# Patient Record
Sex: Female | Born: 1945 | Race: White | Hispanic: No | Marital: Married | Smoking: Never smoker
Health system: Southern US, Community
[De-identification: ages and names within clinical notes are randomized; demographics above are authoritative.]

## PROBLEM LIST (undated history)

## (undated) DIAGNOSIS — R519 Headache, unspecified: Secondary | ICD-10-CM

## (undated) DIAGNOSIS — N939 Abnormal uterine and vaginal bleeding, unspecified: Secondary | ICD-10-CM

## (undated) DIAGNOSIS — R51 Headache: Secondary | ICD-10-CM

## (undated) DIAGNOSIS — M1990 Unspecified osteoarthritis, unspecified site: Secondary | ICD-10-CM

## (undated) HISTORY — PX: HYSTEROSCOPY: SHX211

## (undated) HISTORY — PX: JOINT REPLACEMENT: SHX530

---

## 2004-09-15 ENCOUNTER — Emergency Department: Payer: Self-pay | Admitting: Emergency Medicine

## 2005-05-06 ENCOUNTER — Ambulatory Visit: Payer: Self-pay | Admitting: Unknown Physician Specialty

## 2006-07-29 ENCOUNTER — Ambulatory Visit: Payer: Self-pay | Admitting: Unknown Physician Specialty

## 2006-09-30 ENCOUNTER — Ambulatory Visit: Payer: Self-pay | Admitting: Unknown Physician Specialty

## 2009-09-25 ENCOUNTER — Ambulatory Visit: Payer: Self-pay | Admitting: Unknown Physician Specialty

## 2010-04-23 ENCOUNTER — Ambulatory Visit: Payer: Self-pay | Admitting: Unknown Physician Specialty

## 2010-04-26 ENCOUNTER — Ambulatory Visit: Payer: Self-pay | Admitting: Unknown Physician Specialty

## 2010-04-29 LAB — PATHOLOGY REPORT

## 2010-09-30 ENCOUNTER — Ambulatory Visit: Payer: Self-pay | Admitting: Unknown Physician Specialty

## 2013-08-10 ENCOUNTER — Ambulatory Visit: Payer: Self-pay | Admitting: General Practice

## 2013-08-10 LAB — URINALYSIS, COMPLETE
BLOOD: NEGATIVE
Bilirubin,UR: NEGATIVE
GLUCOSE, UR: NEGATIVE mg/dL (ref 0–75)
Ketone: NEGATIVE
NITRITE: NEGATIVE
PH: 5 (ref 4.5–8.0)
Protein: NEGATIVE
RBC,UR: 3 /HPF (ref 0–5)
Specific Gravity: 1.024 (ref 1.003–1.030)
Squamous Epithelial: <1

## 2013-08-10 LAB — CBC
HCT: 40 % (ref 35.0–47.0)
HGB: 13.2 g/dL (ref 12.0–16.0)
MCH: 29.6 pg (ref 26.0–34.0)
MCHC: 32.9 g/dL (ref 32.0–36.0)
MCV: 90 fL (ref 80–100)
Platelet: 212 10*3/uL (ref 150–440)
RBC: 4.46 10*6/uL (ref 3.80–5.20)
RDW: 14 % (ref 11.5–14.5)
WBC: 3.8 10*3/uL (ref 3.6–11.0)

## 2013-08-10 LAB — BASIC METABOLIC PANEL
Anion Gap: 3 — ABNORMAL LOW (ref 7–16)
BUN: 16 mg/dL (ref 7–18)
CALCIUM: 9.2 mg/dL (ref 8.5–10.1)
CHLORIDE: 104 mmol/L (ref 98–107)
CO2: 30 mmol/L (ref 21–32)
Creatinine: 0.65 mg/dL (ref 0.60–1.30)
EGFR (Non-African Amer.): >60
GLUCOSE: 85 mg/dL (ref 65–99)
Osmolality: 274 (ref 275–301)
POTASSIUM: 4 mmol/L (ref 3.5–5.1)
Sodium: 137 mmol/L (ref 136–145)

## 2013-08-10 LAB — MRSA PCR SCREENING

## 2013-08-10 LAB — APTT: Activated PTT: 35.4 secs (ref 23.6–35.9)

## 2013-08-10 LAB — SEDIMENTATION RATE: ERYTHROCYTE SED RATE: 8 mm/h (ref 0–30)

## 2013-08-10 LAB — PROTIME-INR
INR: 1
PROTHROMBIN TIME: 12.9 s (ref 11.5–14.7)

## 2013-08-12 LAB — URINE CULTURE

## 2013-08-17 ENCOUNTER — Inpatient Hospital Stay: Payer: Self-pay | Admitting: General Practice

## 2013-08-18 LAB — BASIC METABOLIC PANEL
Anion Gap: 5 — ABNORMAL LOW (ref 7–16)
BUN: 10 mg/dL (ref 7–18)
CHLORIDE: 108 mmol/L — AB (ref 98–107)
CO2: 28 mmol/L (ref 21–32)
Calcium, Total: 8.6 mg/dL (ref 8.5–10.1)
Creatinine: 0.68 mg/dL (ref 0.60–1.30)
EGFR (African American): >60
GLUCOSE: 90 mg/dL (ref 65–99)
OSMOLALITY: 280 (ref 275–301)
POTASSIUM: 3.6 mmol/L (ref 3.5–5.1)
Sodium: 141 mmol/L (ref 136–145)

## 2013-08-18 LAB — HEMOGLOBIN: HGB: 10.4 g/dL — ABNORMAL LOW (ref 12.0–16.0)

## 2013-08-18 LAB — PLATELET COUNT: Platelet: 135 10*3/uL — ABNORMAL LOW (ref 150–440)

## 2013-08-19 LAB — BASIC METABOLIC PANEL
ANION GAP: 5 — AB (ref 7–16)
BUN: 5 mg/dL — AB (ref 7–18)
Calcium, Total: 8.6 mg/dL (ref 8.5–10.1)
Chloride: 109 mmol/L — ABNORMAL HIGH (ref 98–107)
Co2: 25 mmol/L (ref 21–32)
Creatinine: 0.67 mg/dL (ref 0.60–1.30)
GLUCOSE: 97 mg/dL (ref 65–99)
Osmolality: 275 (ref 275–301)
Potassium: 3.5 mmol/L (ref 3.5–5.1)
Sodium: 139 mmol/L (ref 136–145)

## 2013-08-19 LAB — HEMOGLOBIN: HGB: 11.1 g/dL — ABNORMAL LOW (ref 12.0–16.0)

## 2013-08-19 LAB — PLATELET COUNT: Platelet: 145 10*3/uL — ABNORMAL LOW (ref 150–440)

## 2014-10-28 NOTE — Discharge Summary (Signed)
PATIENT NAME:  Brianna Hurst, Kess H MR#:  [id number] DATE OF BIRTH:  Dec 19, 1945  DATE OF ADMISSION:  08/17/2013 DATE OF DISCHARGE:  08/20/2013  ADMITTING DIAGNOSIS: Degenerative arthrosis of the left knee.   DISCHARGE DIAGNOSIS: Degenerative arthrosis of the left knee.   HISTORY: The patient is a pleasant 69 year old female who has been followed at [facility] for progression of left knee pain. She had reported a several year history of progressive left knee pain. She had localized most of the pain along the medial aspect of the knee. Her pain was aggravated with weight-bearing activities. She was having difficulty with stair ambulation, also having problems getting up and down. At the time of surgery, she was not using any ambulatory aid. The patient has been treated in the past with nonsteroidal anti-inflammatory medications, corticosteroid injections, as well as physical therapy with only minimal relief. The patient states that the pain increased to the point that it was significantly interfering with her activities of daily living. X-rays taken in [facility] showed narrowing of the medial cartilage space with associated varus alignment. She was noted to have osteophyte as well as subchondral sclerosis. After discussion of the risks and benefits of surgical intervention, the patient expressed her understanding of the risks and benefits and agreed for plans for surgical intervention.   PROCEDURE: Left total knee arthroplasty using computer-assisted navigation.   ANESTHESIA: Spinal.   SOFT TISSUE RELEASE: Anterior cruciate ligament, posterior cruciate ligament, deep medial collateral ligaments, as well as the patellofemoral ligament.   IMPLANTS UTILIZED: DePuy PFC Sigma size 3 posterior stabilized femoral component (cemented), size 3 MBT tibial component (cemented), 32 mm three-pegged oval dome patella (cemented), and a 12.5 mm stabilized rotating platform polyethylene insert.  Gentamicin cement was utilized.   HOSPITAL COURSE: The patient tolerated the procedure very well. She had no complications. She was then taken to the PAC-U where she was stabilized and then transferred to the orthopedic floor. The patient began receiving anticoagulation therapy of Lovenox 30 mg subcutaneous every 12 hours per anesthesia and pharmacy protocol. She was fitted with TED stockings bilaterally. The left one was applied on day 2 following removal of the Hemovac and dressing change. The patient was also fitted with the AV-I compression foot pumps bilaterally set at 80 mmHg. Her calves have been nontender. There has been no evidence of any DVTs. Negative Homans sign. Heels were elevated off the bed using rolled towels.   The patient has denied any chest pain or shortness of breath. Vital signs have been stable. She has been afebrile. Hemodynamically she was stable and no transfusions were given other than the Autovac transfusions given the first 6 hours postoperatively.   Physical therapy was initiated on day 1 for gait training and transfers. She has done very well. Upon being discharged was ambulating greater than 200 feet. She was independent with bed to chair transfers. She was able to go up 4 steps. Occupational therapy was also initiated on day 1 for ADL and assistive devices.   The patient's IV, Foley and Hemovac were DC'd on day 2 along with a dressing change. The wound was free of any drainage or signs of infection. Polar Care was reapplied to the surgical leg maintaining a temperature of 40 to 50 degrees Fahrenheit.   DRUG ALLERGIES: No known drug allergies.   DISCHARGE INSTRUCTIONS: The patient is to continue weight-bearing as tolerated. She will continue using a walker until cleared by physical therapy to go to a quad cane. She  will receive home health PT. She is instructed on elevation of the lower extremities. She is to continue with TED stockings bilaterally. These may be removed  at night but are to be worn during the day. She is to continue the Polar Care maintaining a temperature of 40 to 50 degrees Fahrenheit. For the first 2 weeks, we would like for her to try to wear this around-the-clock. Also recommend that she continue to try to use incentive spirometry q. hour while awake until she is more active. Encourage cough and deep breathing every 2 hours while awake. She was instructed in wound care. She is not to get the wound wet until the staples are removed. She has a follow-up appointment on February 26th at 8:45. Call if any temperatures of 101.5 or greater or excessive bleeding. She is to resume her regular medication that she was on prior to admission. She was given a prescription for oxycodone 5 to 10 mg q. 4 to 6 hours p.r.n. for pain, Ultram 50 to 100 mg q. 4 to 6 hours p.r.n. for pain, and Lovenox 40 mg subcu daily for 14 days and then discontinue and begin taking one 81 mg enteric-coated aspirin.   PAST MEDICAL HISTORY: Migraines, plantar fasciitis, mumps, varicella. ____________________________ Van Clines, PA jrw:sb D: 08/30/2013 08:34:27 ET T: 08/30/2013 09:17:24 ET JOB#: [id number]  cc: Van Clines, PA, <Dictator> Gotti Alwin PA ELECTRONICALLY SIGNED 09/05/2013 7:17

## 2014-10-28 NOTE — Op Note (Signed)
PATIENT NAME:  Brianna Hurst, Brianna Hurst MR#:  [id number] DATE OF BIRTH:  1945/08/24  DATE OF PROCEDURE:  08/17/2013  PREOPERATIVE DIAGNOSIS: Degenerative arthrosis of the left knee.   POSTOPERATIVE DIAGNOSIS: Degenerative arthrosis of the left knee.   PROCEDURE PERFORMED: Left total knee arthroplasty using computer-assisted navigation.   SURGEON: Illene Labrador. Angie Fava., M.D.   ASSISTANT: Van Clines, PA (required to maintain retraction throughout the procedure).   ANESTHESIA: Spinal.   ESTIMATED BLOOD LOSS: 50 mL.   FLUIDS REPLACED: 1200 mL of crystalloid.   TOURNIQUET TIME: 83 minutes.   DRAINS: Two medium drains to reinfusion system.   SOFT TISSUE RELEASES: Anterior cruciate ligament, posterior cruciate ligament, deep medial collateral ligament, and patellofemoral ligament.   IMPLANTS UTILIZED: DePuy PFC Sigma size 3 posterior stabilized femoral component (cemented), size 3 MBT tibial component (cemented), 32 mm 3 peg oval dome patella (cemented), and a 12.5 mm stabilized rotating platform polyethylene insert. Gentamicin cement was utilized.   INDICATIONS FOR SURGERY: The patient is a 69 year old female who has been seen for complaints of progressive left knee pain. X-rays demonstrated significant degenerative changes with relative varus deformity. After discussion of the risks and benefits of surgical intervention, the patient expressed understanding of the risks and benefits and agreed with plans for surgical intervention.   PROCEDURE IN DETAIL: The patient was brought into the operating room and, after adequate spinal anesthesia was achieved, a tourniquet was placed on the patient's upper left thigh. The patient's left knee and leg were cleaned and prepped with alcohol and DuraPrep and draped in the usual sterile fashion. A "timeout" was performed as per usual protocol. The left lower extremity was exsanguinated using an Esmarch, and the tourniquet was inflated to 300 mmHg. An anterior  longitudinal incision was made followed by a standard mid vastus approach. A large effusion was evacuated. The deep fibers of the medial collateral ligament were elevated in a subperiosteal fashion off the medial flare of the tibia so as to maintain a continuous soft tissue sleeve. The patella was subluxed laterally, and the patellofemoral ligament was incised. Inspection of the knee demonstrated severe degenerative changes with full-thickness loss of articular cartilage to the medial compartment. Significant degenerative changes were also noted to the patella. Osteophytes were debrided using a rongeur. Anterior and posterior cruciate ligaments were excised. Two 4.0 mm Schanz pins were inserted into the femur and into the tibia for attachment of the array of trackers used for computer-assisted navigation. Hip center was identified using circumduction technique. Distal landmarks were mapped using the computer. The distal femur and proximal tibia were mapped using the computer. Distal femoral cutting guide was positioned using computer-assisted navigation so as to achieve a 5-degree distal valgus cut. Cut was performed and verified using the computer. Distal femur was sized, and it was felt that a size 3 femoral component was appropriate. A size 3 cutting guide was positioned, and anterior cut was performed and verified using the computer. This was followed by completion of the posterior and chamfer cuts. Femoral cutting guide for the central box was then positioned, and the central box cut was performed. Attention was then directed to the proximal tibia. Medial and lateral menisci were excised. The extramedullary tibial cutting guide was positioned using computer-assisted navigation so as to achieve a 0-degree varus/valgus alignment and 0-degree posterior slope. Cut was performed and verified using the computer. The proximal tibia was sized, and it was felt that a size 3 tibial tray was appropriate. Tibial and  femoral trials  were inserted followed by insertion of first a 10 and subsequently a 12.5 mm polyethylene insert. This allowed for excellent mediolateral soft tissue balancing both in full extension and in flexion. Finally, the patella was cut and prepared so as to accommodate a 32 mm 3 peg oval dome patella. Patellar trial was placed, and the knee was placed through a range of motion with excellent patellar tracking appreciated.   Femoral trial was removed. Central post hole for the tibial component was reamed followed by insertion of a keel punch. Tibial trials were then removed. The cut surfaces of bone were irrigated with copious amounts of normal saline with antibiotic solution using pulsatile lavage and then suctioned dry. Polymethylmethacrylate cement with gentamicin was prepared in the usual fashion using a vacuum mixer. Cement was applied to the cut surface of the proximal tibia as well as along the undersurface of a size 3 MBT tibial component. The tibial component was positioned and impacted into place. Excess cement was removed using Personal assistantreer elevators. Cement was then applied to the cut surface of the femur as well as on the posterior flanges of a size 3 posterior stabilized femoral component. Femoral component was positioned and impacted into place. Excess cement was removed using Personal assistantreer elevators. A 12.5 mm polyethylene trial was inserted, and the knee was brought into full extension with steady axial compression applied. Finally, cement was applied to the backside of a 32 mm 3 peg oval dome patella, and patellar component was positioned and patellar clamp applied. Excess cement was removed using Personal assistantreer elevators.   After adequate curing of cement, the tourniquet was deflated after a total tourniquet time of 83 minutes. Hemostasis was achieved using electrocautery. The knee was irrigated with copious amounts of normal saline with antibiotic solution using pulsatile lavage and then suctioned dry. The  knee was inspected for any residual cement debris. Then, 20 mL of 1.3% Exparel in 40 mL of normal saline was injected along the posterior capsule, medial and lateral gutters, and along the arthrotomy site. A 12.5 mm stabilized rotating platform polyethylene insert was inserted, and the knee was placed through range of motion with excellent patellar tracking appreciated and excellent mediolateral soft tissue balancing noted. Two medium drains were placed in the wound bed and brought out through a separate stab incision to be attached to a reinfusion system. The medial parapatellar portion of the incision was reapproximated using interrupted sutures of #1 Vicryl. The subcutaneous tissue was approximated in layers using first #0 Vicryl followed by #2-0 Vicryl. Skin was closed with skin staples. Sterile dressing was applied.   The patient tolerated the procedure well. She was transported to the recovery room in stable condition.    ____________________________ Illene LabradorJames P. Angie FavaHooten Jr., MD jph:gb D: 08/17/2013 21:13:15 ET T: 08/17/2013 21:56:08 ET JOB#: [id number]  cc: Illene LabradorJames P. Angie FavaHooten Jr., MD, <Dictator> JAMES P Angie FavaHOOTEN JR MD ELECTRONICALLY SIGNED 08/26/2013 6:42

## 2015-02-05 IMAGING — CR DG KNEE 1-2V*L*
1 series · 2 of 2 positions shown · non-contrast
Comparison: None.

CLINICAL DATA: Status post arthroplasty.

EXAM:
LEFT KNEE - 1-2 VIEW

[Series 1: ap · 0.17mm/px · 2 of 2 slices shown]
[im 1/2]
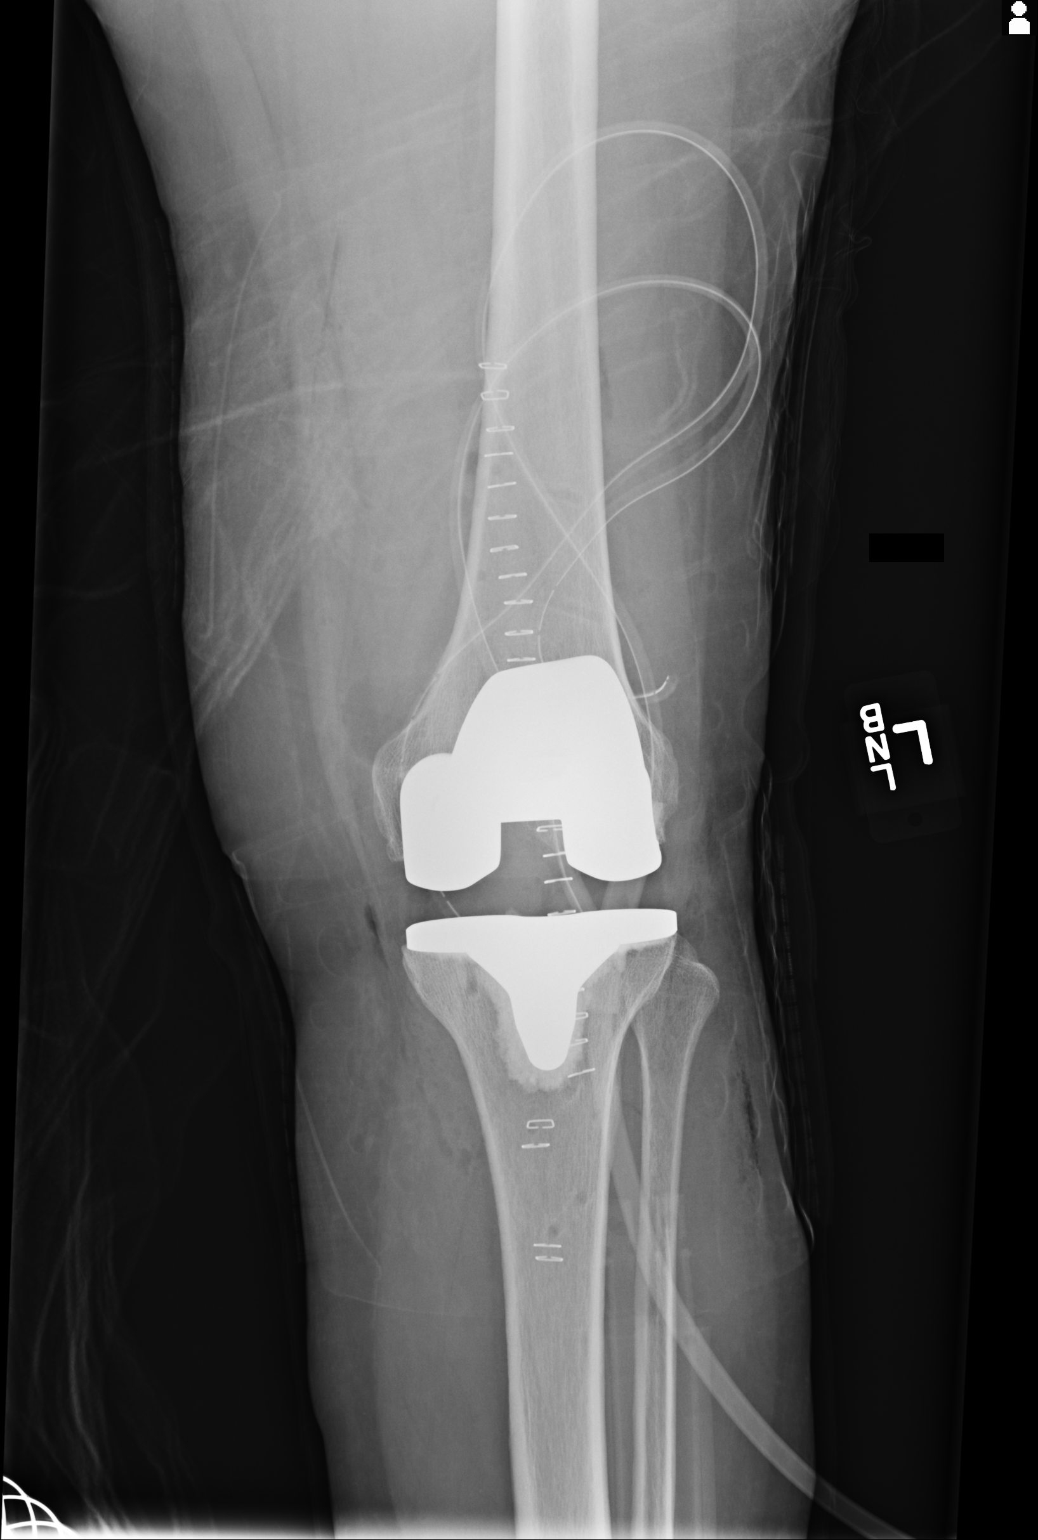
[im 2/2]
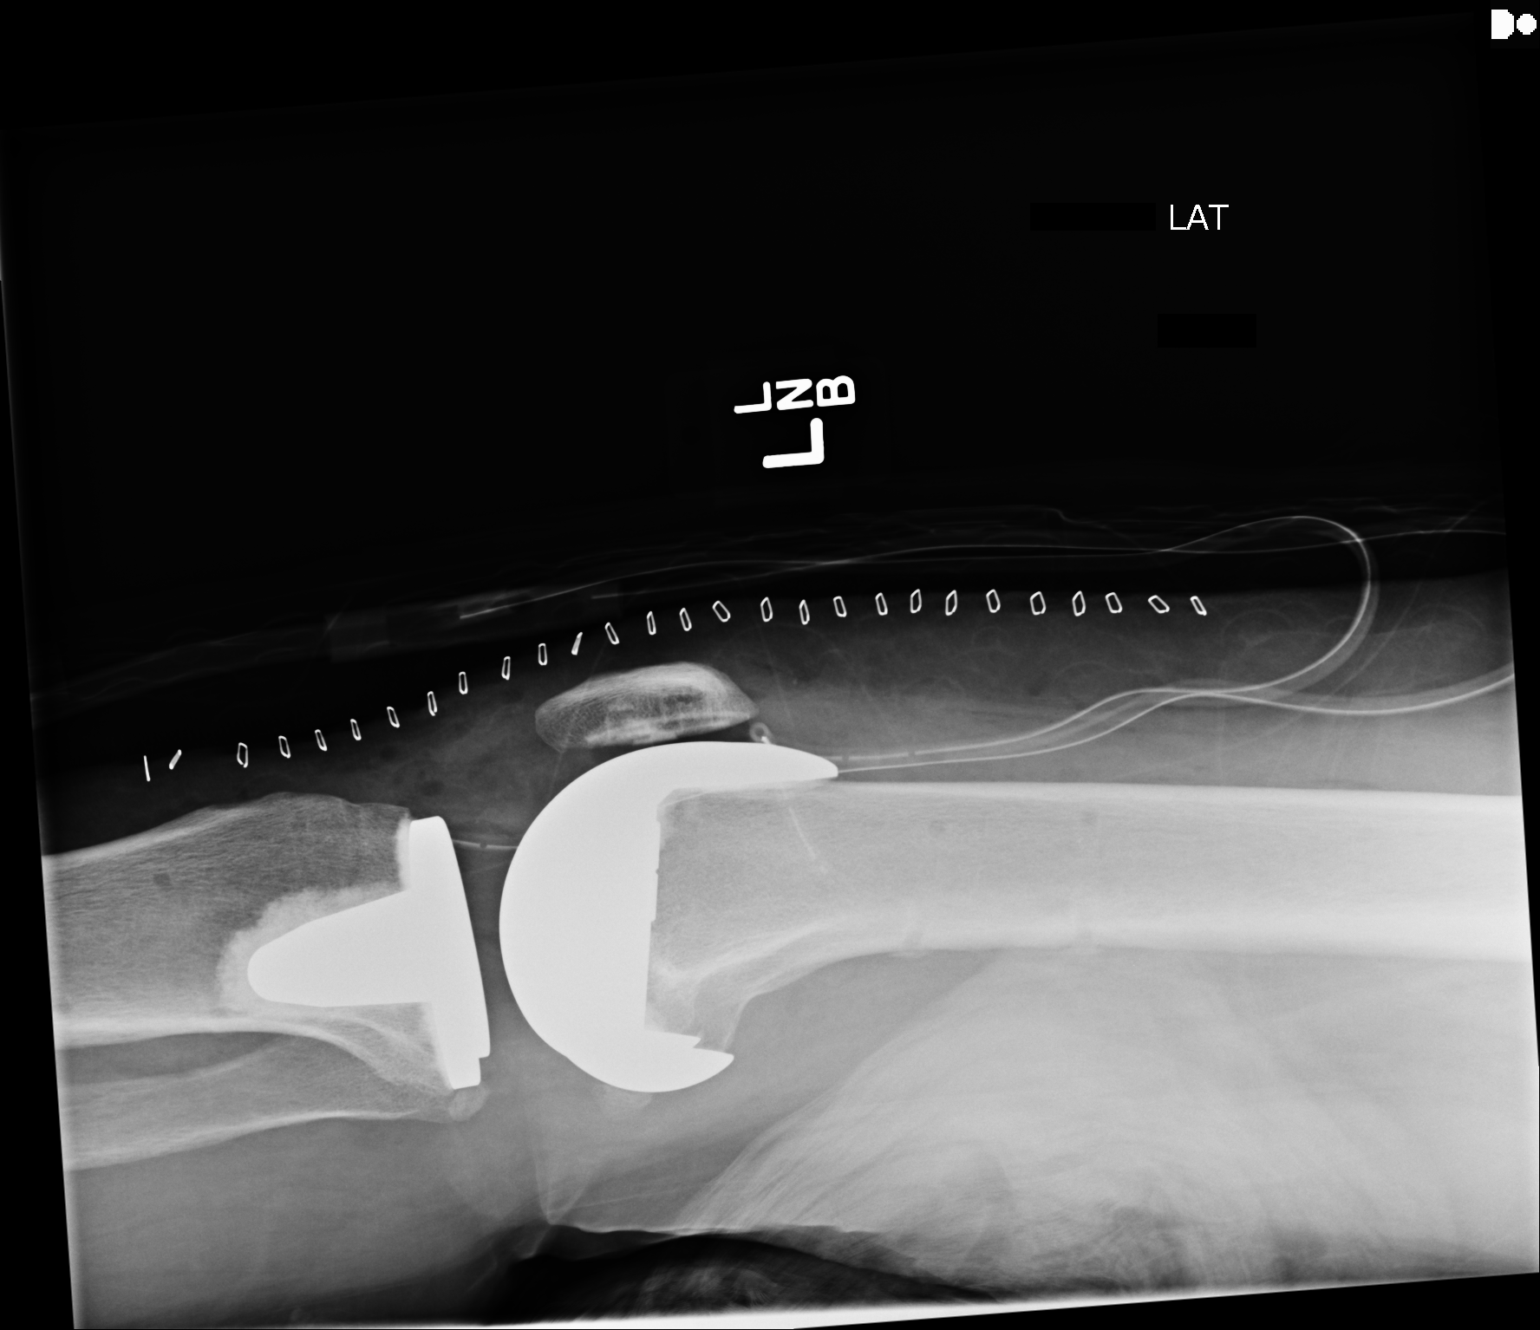

[2 of 2 positions shown; findings below may reference images not displayed]

FINDINGS: The femoral tibial components appear to be well positioned. Surgical
staples are noted. Surgical drains are noted as well.
IMPRESSION: Status post left total knee arthroplasty.

## 2015-07-13 ENCOUNTER — Encounter
Admission: RE | Admit: 2015-07-13 | Discharge: 2015-07-13 | Disposition: A | Discharge status: Home / Self Care | Payer: Medicare Other | Source: Ambulatory Visit | Attending: Obstetrics & Gynecology | Admitting: Obstetrics & Gynecology

## 2015-07-13 DIAGNOSIS — Z01810 Encounter for preprocedural cardiovascular examination: Secondary | ICD-10-CM | POA: Insufficient documentation

## 2015-07-13 HISTORY — DX: Headache, unspecified: R51.9

## 2015-07-13 HISTORY — DX: Abnormal uterine and vaginal bleeding, unspecified: N93.9

## 2015-07-13 HISTORY — DX: Headache: R51

## 2015-07-13 HISTORY — DX: Unspecified osteoarthritis, unspecified site: M19.90

## 2015-07-13 LAB — CBC
HCT: 41.2 % (ref 35.0–47.0)
Hemoglobin: 13.6 g/dL (ref 12.0–16.0)
MCH: 28.8 pg (ref 26.0–34.0)
MCHC: 33 g/dL (ref 32.0–36.0)
MCV: 87.2 fL (ref 80.0–100.0)
Platelets: 211 10*3/uL (ref 150–440)
RBC: 4.73 MIL/uL (ref 3.80–5.20)
RDW: 14 % (ref 11.5–14.5)
WBC: 4.2 10*3/uL (ref 3.6–11.0)

## 2015-07-13 LAB — TYPE AND SCREEN
ABO/RH(D): O POS
ANTIBODY SCREEN: NEGATIVE

## 2015-07-13 LAB — ABO/RH: ABO/RH(D): O POS

## 2015-07-13 NOTE — Patient Instructions (Signed)
  Your procedure is scheduled on: 07/20/15 Fri Report to Day Surgery. To find out your arrival time please call [phone] between 1PM - 3PM on 07/19/15 Thur.  Remember: Instructions that are not followed completely may result in serious medical risk, up to and including death, or upon the discretion of your surgeon and anesthesiologist your surgery may need to be rescheduled.    __x__ 1. Do not eat food or drink liquids after midnight. No gum chewing or hard candies.     ____ 2. No Alcohol for 24 hours before or after surgery.   ____ 3. Bring all medications with you on the day of surgery if instructed.    __x__ 4. Notify your doctor if there is any change in your medical condition     (cold, fever, infections).     Do not wear jewelry, make-up, hairpins, clips or nail polish.  Do not wear lotions, powders, or perfumes. You may wear deodorant.  Do not shave 48 hours prior to surgery. Men may shave face and neck.  Do not bring valuables to the hospital.    [facility] for any belongings or valuables.               Contacts, dentures or bridgework may not be worn into surgery.  Leave your suitcase in the car. After surgery it may be brought to your room.  For patients admitted to the hospital, discharge time is determined by your                treatment team.   Patients discharged the day of surgery will not be allowed to drive home.   Please read over the following fact sheets that you were given:      ____ Take these medicines the morning of surgery with A SIP OF WATER:    1. None  2.   3.   4.  5.  6.  ____ Fleet Enema (as directed)   ____ Use CHG Soap as directed  ____ Use inhalers on the day of surgery  ____ Stop metformin 2 days prior to surgery    ____ Take 1/2 of usual insulin dose the night before surgery and none on the morning of surgery.   ____ Stop Coumadin/Plavix/aspirin on   _x___ Stop Anti-inflammatories on No asprin, ibprofen,  etc til after surgery.  May use Tylenol as needed   ____ Stop supplements until after surgery.    ____ Bring C-Pap to the hospital.

## 2015-07-20 ENCOUNTER — Encounter: Payer: Self-pay | Admitting: *Deleted

## 2015-07-20 ENCOUNTER — Encounter
Admission: RE | Disposition: A | Discharge status: Home / Self Care | Payer: Self-pay | Source: Ambulatory Visit | Attending: Obstetrics & Gynecology

## 2015-07-20 ENCOUNTER — Ambulatory Visit
Admission: RE | Admit: 2015-07-20 | Discharge: 2015-07-20 | Disposition: A | Discharge status: Home / Self Care | Payer: Medicare Other | Source: Ambulatory Visit | Attending: Obstetrics & Gynecology | Admitting: Obstetrics & Gynecology

## 2015-07-20 ENCOUNTER — Ambulatory Visit: Payer: Medicare Other | Admitting: Anesthesiology

## 2015-07-20 DIAGNOSIS — N950 Postmenopausal bleeding: Secondary | ICD-10-CM | POA: Diagnosis not present

## 2015-07-20 DIAGNOSIS — Z96659 Presence of unspecified artificial knee joint: Secondary | ICD-10-CM | POA: Insufficient documentation

## 2015-07-20 DIAGNOSIS — Z803 Family history of malignant neoplasm of breast: Secondary | ICD-10-CM | POA: Insufficient documentation

## 2015-07-20 DIAGNOSIS — Z79899 Other long term (current) drug therapy: Secondary | ICD-10-CM | POA: Diagnosis not present

## 2015-07-20 DIAGNOSIS — Z833 Family history of diabetes mellitus: Secondary | ICD-10-CM | POA: Diagnosis not present

## 2015-07-20 DIAGNOSIS — Z8249 Family history of ischemic heart disease and other diseases of the circulatory system: Secondary | ICD-10-CM | POA: Insufficient documentation

## 2015-07-20 DIAGNOSIS — N939 Abnormal uterine and vaginal bleeding, unspecified: Secondary | ICD-10-CM

## 2015-07-20 HISTORY — PX: DILATATION & CURETTAGE/HYSTEROSCOPY WITH MYOSURE: SHX6511

## 2015-07-20 SURGERY — DILATATION & CURETTAGE/HYSTEROSCOPY WITH MYOSURE
Anesthesia: General

## 2015-07-20 MED ORDER — ONDANSETRON HCL 4 MG/2ML IJ SOLN
4.0000 mg | Freq: Once | INTRAMUSCULAR | Status: DC | PRN
Start: 2015-07-20 — End: 2015-07-20

## 2015-07-20 MED ORDER — DEXAMETHASONE SODIUM PHOSPHATE 10 MG/ML IJ SOLN
INTRAMUSCULAR | Status: DC | PRN
  Administered 2015-07-20: 4 mg via INTRAVENOUS

## 2015-07-20 MED ORDER — LACTATED RINGERS IV SOLN
INTRAVENOUS | Status: DC
  Administered 2015-07-20 (×2): via INTRAVENOUS

## 2015-07-20 MED ORDER — ESTROGENS, CONJUGATED 0.625 MG/GM VA CREA
TOPICAL_CREAM | VAGINAL | Status: AC
  Filled 2015-07-20: qty 30

## 2015-07-20 MED ORDER — FAMOTIDINE 20 MG PO TABS
20.0000 mg | ORAL_TABLET | Freq: Once | ORAL | Status: AC
  Administered 2015-07-20: 20 mg via ORAL

## 2015-07-20 MED ORDER — ONDANSETRON HCL 4 MG/2ML IJ SOLN
INTRAMUSCULAR | Status: DC | PRN
  Administered 2015-07-20: 4 mg via INTRAVENOUS

## 2015-07-20 MED ORDER — MIDAZOLAM HCL 2 MG/2ML IJ SOLN
INTRAMUSCULAR | Status: DC | PRN
  Administered 2015-07-20: 2 mg via INTRAVENOUS

## 2015-07-20 MED ORDER — FENTANYL CITRATE (PF) 100 MCG/2ML IJ SOLN: 25.0000 ug | INTRAMUSCULAR | Status: DC | PRN

## 2015-07-20 MED ORDER — ESTROGENS, CONJUGATED 0.625 MG/GM VA CREA
TOPICAL_CREAM | VAGINAL | Status: DC | PRN
  Administered 2015-07-20: 1 via VAGINAL

## 2015-07-20 MED ORDER — SILVER NITRATE-POT NITRATE 75-25 % EX MISC
CUTANEOUS | Status: AC
  Filled 2015-07-20: qty 4

## 2015-07-20 MED ORDER — FENTANYL CITRATE (PF) 100 MCG/2ML IJ SOLN
INTRAMUSCULAR | Status: DC | PRN
  Administered 2015-07-20: 50 ug via INTRAVENOUS

## 2015-07-20 MED ORDER — FAMOTIDINE 20 MG PO TABS
ORAL_TABLET | ORAL | Status: AC
  Filled 2015-07-20: qty 1

## 2015-07-20 MED ORDER — LIDOCAINE HCL (CARDIAC) 20 MG/ML IV SOLN
INTRAVENOUS | Status: DC | PRN
  Administered 2015-07-20: 60 mg via INTRAVENOUS

## 2015-07-20 MED ORDER — OXYCODONE-ACETAMINOPHEN 5-325 MG PO TABS: 1.0000 | ORAL_TABLET | ORAL | Status: AC | PRN

## 2015-07-20 MED ORDER — KETOROLAC TROMETHAMINE 30 MG/ML IJ SOLN
30.0000 mg | Freq: Four times a day (QID) | INTRAMUSCULAR | Status: DC
  Administered 2015-07-20: 30 mg via INTRAVENOUS
  Filled 2015-07-20: qty 1

## 2015-07-20 MED ORDER — PROPOFOL 10 MG/ML IV BOLUS
INTRAVENOUS | Status: DC | PRN
  Administered 2015-07-20: 150 mg via INTRAVENOUS

## 2015-07-20 SURGICAL SUPPLY — 23 items
ABLATOR ENDOMETRIAL MYOSURE (ABLATOR) IMPLANT
BAG COUNTER SPONGE EZ (MISCELLANEOUS) ×2 IMPLANT
CANISTER SUC SOCK COL 7IN (MISCELLANEOUS) ×3 IMPLANT
CATH ROBINSON RED A/P 16FR (CATHETERS) ×3 IMPLANT
COUNTER SPONGE BAG EZ (MISCELLANEOUS) ×1
GAUZE PACK 2X3YD (MISCELLANEOUS) ×3 IMPLANT
GLOVE BIO SURGEON STRL SZ8 (GLOVE) ×3 IMPLANT
GOWN STRL REUS W/ TWL LRG LVL3 (GOWN DISPOSABLE) ×1 IMPLANT
GOWN STRL REUS W/ TWL XL LVL3 (GOWN DISPOSABLE) ×1 IMPLANT
GOWN STRL REUS W/TWL LRG LVL3 (GOWN DISPOSABLE) ×2
GOWN STRL REUS W/TWL XL LVL3 (GOWN DISPOSABLE) ×2
MYOSURE LITE POLYP REMOVAL (MISCELLANEOUS) IMPLANT
PACK DNC HYST (MISCELLANEOUS) ×3 IMPLANT
PAD GROUND ADULT SPLIT (MISCELLANEOUS) ×3 IMPLANT
PAD OB MATERNITY 4.3X12.25 (PERSONAL CARE ITEMS) ×3 IMPLANT
PAD PREP 24X41 OB/GYN DISP (PERSONAL CARE ITEMS) ×3 IMPLANT
SOL .9 NS 3000ML IRR  AL (IV SOLUTION) ×2
SOL .9 NS 3000ML IRR UROMATIC (IV SOLUTION) ×1 IMPLANT
STRAP SAFETY BODY (MISCELLANEOUS) ×3 IMPLANT
TOWEL OR 17X26 4PK STRL BLUE (TOWEL DISPOSABLE) ×3 IMPLANT
TUBING CONNECTING 10 (TUBING) ×2 IMPLANT
TUBING CONNECTING 10' (TUBING) ×1
TUBING HYSTEROSCOPY DOLPHIN (MISCELLANEOUS) ×3 IMPLANT

## 2015-07-20 NOTE — Anesthesia Postprocedure Evaluation (Signed)
Anesthesia Post Note  Patient: Brianna Hurst  Procedure(s) Performed: Procedure(s) (LRB): DILATATION & CURETTAGE/HYSTEROSCOPY WITH MYOSURE (N/A)  Patient location during evaluation: PACU Anesthesia Type: General Level of consciousness: awake and alert Pain management: pain level controlled Vital Signs Assessment: post-procedure vital signs reviewed and stable Respiratory status: spontaneous breathing and respiratory function stable Cardiovascular status: stable Anesthetic complications: no    Last Vitals:  Filed Vitals:   07/20/15 1213 07/20/15 1232  BP:  119/67  Pulse:  69  Temp: 37.1 C 35.9 C  Resp:  18    Last Pain:  Filed Vitals:   07/20/15 1234  PainSc: 1                  KEPHART,WILLIAM K

## 2015-07-20 NOTE — Anesthesia Procedure Notes (Signed)
Procedure Name: LMA Insertion Date/Time: 07/20/2015 10:45 AM Performed by: Jannet MantisPACE, Evangelynn Lochridge Pre-anesthesia Checklist: Patient identified, Emergency Drugs available, Suction available, Patient being monitored and Timeout performed Patient Re-evaluated:Patient Re-evaluated prior to inductionOxygen Delivery Method: Circle system utilized Preoxygenation: Pre-oxygenation with 100% oxygen Intubation Type: IV induction Ventilation: Mask ventilation without difficulty LMA: LMA inserted LMA Size: 4.0 Number of attempts: 1 Placement Confirmation: positive ETCO2 and CO2 detector Dental Injury: Teeth and Oropharynx as per pre-operative assessment

## 2015-07-20 NOTE — Anesthesia Preprocedure Evaluation (Signed)
Anesthesia Evaluation  Patient identified by MRN, date of birth, ID band Patient awake    Reviewed: Allergy & Precautions, NPO status , Patient's Chart, lab work & pertinent test results  History of Anesthesia Complications Negative for: history of anesthetic complications  Airway Mallampati: I       Dental   Pulmonary neg pulmonary ROS,           Cardiovascular negative cardio ROS       Neuro/Psych negative neurological ROS     GI/Hepatic negative GI ROS, Neg liver ROS,   Endo/Other  negative endocrine ROS  Renal/GU negative Renal ROS     Musculoskeletal  (+) Arthritis , Osteoarthritis,    Abdominal   Peds  Hematology negative hematology ROS (+)   Anesthesia Other Findings 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     Plan Discussed with:   Anesthesia Plan Comments:         Anesthesia Quick Evaluation

## 2015-07-20 NOTE — H&P (Signed)
History and Physical Interval Note:  07/20/2015 9:40 AM  Brianna Hurst  has presented today for surgery, with the diagnosis of endometrial abnormality  The various methods of treatment have been discussed with the patient and family. After consideration of risks, benefits and other options for treatment, the patient has consented to  Procedure(s): DILATATION & CURETTAGE/HYSTEROSCOPY WITH MYOSURE (N/A) as a surgical intervention .  The patient's history has been reviewed, patient examined, no change in status, stable for surgery.  Pt has the following beta blocker history-  Not taking Beta Blocker.  I have reviewed the patient's chart and labs.  Questions were answered to the patient's satisfaction.       Wilmetta Speiser Renae FicklePAUL

## 2015-07-20 NOTE — Transfer of Care (Signed)
Immediate Anesthesia Transfer of Care Note  Patient: Brianna Hurst  Procedure(s) Performed: Procedure(s): DILATATION & CURETTAGE/HYSTEROSCOPY WITH MYOSURE (N/A)  Patient Location: PACU  Anesthesia Type:General  Level of Consciousness: awake  Airway & Oxygen Therapy: Patient Spontanous Breathing  Post-op Assessment: Report given to RN  Post vital signs: stable  Last Vitals:  Filed Vitals:   07/20/15 0936 07/20/15 1126  BP: 112/73 121/63  Pulse: 69 79  Temp: 36.7 C 36.4 C  Resp: 16 18    Complications: No apparent anesthesia complications

## 2015-07-20 NOTE — Op Note (Signed)
Operative Note  07/20/2015  PRE-OP DIAGNOSIS: Postmenopausal Bleeding  POST-OP DIAGNOSIS: same   SURGEON: Annamarie MajorPaul Roosevelt Eimers, MD, FACOG  PROCEDURE: Procedure(s): DILATATION & CURETTAGE/HYSTEROSCOPY WITH MYOSURE   ANESTHESIA: Choice   ESTIMATED BLOOD LOSS: Min, <100 mL   SPECIMENS:  ECC, EMC  FLUID DEFICIT: Min  COMPLICATIONS: None  DISPOSITION: PACU - hemodynamically stable.  CONDITION: stable  FINDINGS: Exam under anesthesia revealed small, mobile  uterus with no masses and bilateral adnexa without masses or fullness. Hysteroscopy revealed a  grossly normal appearing uterine cavity with bilateral tubal ostia and normal appearing endocervical canal.  PROCEDURE IN DETAIL: After informed consent was obtained, the patient was taken to the operating room where anesthesia was obtained without difficulty. The patient was positioned in the dorsal lithotomy position in [place] stirrups. The patient's bladder was catheterized with an in and out foley catheter. The patient was examined under anesthesia, with the above noted findings. The weightedspeculum was placed inside the patient's vagina, and the the anterior lip of the cervix was seen and grasped with the tenaculum.  An Endocervical specimen was obtained with a kevorkian curette. The uterine cavity was sounded to 6cm, and then the cervix was progressively dilated to a 16 French-Pratt dilator. The 30 degree hysteroscope was introduced, with saline fluid used to distend the intrauterine cavity, with the above noted findings.  The hystersocope was removed and the uterine cavity was curetted until a gritty texture was noted, yielding endometrial curettings. Excellent hemostasis was noted, and all instruments were removed, with excellent hemostasis noted throughout. She was then taken out of dorsal lithotomy. Minimal discrepancy in fluid was noted.  The patient tolerated the procedure well. Sponge, lap and needle counts were correct x2. The patient was  taken to recovery room in excellent condition.

## 2015-07-20 NOTE — Discharge Instructions (Signed)
General Gynecological Post-Operative Instructions °You may expect to feel dizzy, weak, and drowsy for as long as 24 hours after receiving the medicine that made you sleep (anesthetic).  °Do not drive a car, ride a bicycle, participate in physical activities, or take public transportation until you are done taking narcotic pain medicines or as directed by your doctor.  °Do not drink alcohol or take tranquilizers.  °Do not take medicine that has not been prescribed by your doctor.  °Do not sign important papers or make important decisions while on narcotic pain medicines.  °Have a responsible person with you.  °CARE OF INCISION  °Keep incision clean and dry. °Take showers instead of baths until your doctor gives you permission to take baths.  °Avoid heavy lifting (more than 10 pounds/4.5 kilograms), pushing, or pulling.  °Avoid activities that may risk injury to your surgical site.  °No sexual intercourse or placement of anything in the vagina for 2 weeks or as instructed by your doctor. °If you have tubes coming from the wound site, check with your doctor regarding appropriate care of the tubes. °Only take prescription or over-the-counter medicines  for pain, discomfort, or fever as directed by your doctor. Do not take aspirin. It can make you bleed. Take medicines (antibiotics) that kill germs if they are prescribed for you.  °Call the office or go to the ER if:  °You feel sick to your stomach (nauseous) and you start to throw up (vomit).  °You have trouble eating or drinking.  °You have an oral temperature above 101.  °You have constipation that is not helped by adjusting diet or increasing fluid intake. Pain medicines are a common cause of constipation.  °You have any other concerns. °SEEK IMMEDIATE MEDICAL CARE IF:  °You have persistent dizziness.  °You have difficulty breathing or a congested sounding (croupy) cough.  °You have an oral temperature above 102.5, not controlled by medicine.  °There is increasing  pain or tenderness near or in the surgical site.  ° ° ° °

## 2015-07-23 LAB — SURGICAL PATHOLOGY

## 2015-12-10 ENCOUNTER — Ambulatory Visit: Payer: Medicare Other | Attending: Obstetrics & Gynecology | Admitting: Physical Therapy

## 2016-02-06 ENCOUNTER — Ambulatory Visit: Payer: Medicare Other | Attending: Obstetrics & Gynecology | Admitting: Physical Therapy

## 2016-02-06 DIAGNOSIS — M25562 Pain in left knee: Secondary | ICD-10-CM | POA: Diagnosis present

## 2016-02-06 DIAGNOSIS — M25561 Pain in right knee: Secondary | ICD-10-CM | POA: Diagnosis not present

## 2016-02-06 DIAGNOSIS — M25511 Pain in right shoulder: Secondary | ICD-10-CM | POA: Insufficient documentation

## 2016-02-06 DIAGNOSIS — M6281 Muscle weakness (generalized): Secondary | ICD-10-CM | POA: Insufficient documentation

## 2016-02-07 NOTE — Therapy (Signed)
[facility] [address], [place], [address] Phone: [phone]   Fax:  [phone]  Physical Therapy Evaluation  Patient Details  Name: Brianna Hurst MRN: [id number] Date of Birth: 10-11-1945 Referring Provider: Artis Flock  Encounter Date: 02/06/2016      PT End of Session - 02/07/16 0651    Visit Number 1   Number of Visits 13   Date for PT Re-Evaluation 03/20/16   Authorization Type g code   PT Start Time 0730   PT Stop Time 0830   PT Time Calculation (min) 60 min   Activity Tolerance Patient tolerated treatment well      Past Medical History:  Diagnosis Date  . Arthritis   . Headache   . Vaginal bleeding    discomfort    Past Surgical History:  Procedure Laterality Date  . DILATATION & CURETTAGE/HYSTEROSCOPY WITH MYOSURE N/A 07/20/2015   Procedure: DILATATION & CURETTAGE/HYSTEROSCOPY WITH MYOSURE;  Surgeon: Nadara Mustard, MD;  Location: [facility] ORS;  Service: Gynecology;  Laterality: N/A;  . HYSTEROSCOPY    . JOINT REPLACEMENT     Lt knee    There were no vitals filed for this visit.       Subjective Assessment - 02/06/16 0811    Subjective Pt reports several weeks of shoulder and knee pain. Shoulder pain began when pt was preparing for a visitor and "overdid" her cleaning routine. Pain has improved slowly. Knee pain began when pt was in her car and turned, pain was lancinating but has improved slowly.    Pertinent History h/o L knee pain with TKA 2015.    Limitations Sitting;Walking   Patient Stated Goals return to gardening, to get stronger   Currently in Pain? Yes   Pain Score 1    Pain Location Shoulder            [facility] PT Assessment - 02/07/16 0001      Assessment   Medical Diagnosis right shoulder impingement and left acute knee pain   Referring Provider Artis Flock   Hand Dominance Right   Prior Therapy none for this     Precautions   Precautions None     Restrictions    Weight Bearing Restrictions No     Balance Screen   Has the patient fallen in the past 6 months No   Has the patient had a decrease in activity level because of a fear of falling?  No   Is the patient reluctant to leave their home because of a fear of falling?  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     Cognition   Overall Cognitive Status Within Functional Limits for tasks assessed     Observation/Other Assessments   Lower Extremity Functional Scale  43/80   Quick DASH  50     Sensation   Light Touch Appears Intact   Additional Comments pt reports period full leg N/T in LLE.     Posture/Postural Control   Posture Comments FHP, R shoulder protracted     ROM / Strength   AROM / PROM / Strength AROM;Strength     AROM   Overall AROM Comments shoulder AROM is WFL, however pt displays poor scapular control with abduction and c/o painful arc at 90 with flexion and abduction, worse with abduction. IR  limited to hand to sacrum and painful. L cervical rotation and lateral flexion also limited and reproduces pain.  pt is also limited in hip extension B.     Strength   Overall Strength Comments pain with shoulder abduction, flexion, 3/5 strength likely due to pain. scapular control is lacking through not formally MMT.  Hip extension 2/5 B     Flexibility   Soft Tissue Assessment /Muscle Length yes     Palpation   Spinal mobility Deferred PA mobs to assess for role of thoracic spine   Palpation comment pain with palpation of R supraspinatus, levator scap, serratus anterior     Special Tests    Special Tests Rotator Cuff Impingement;Biceps/Labral Tests   Rotator Cuff Impingment tests Neer impingement test;Hawkins- Kennedy test;Full Can test;Speed's test   Biceps/Labral tests Speeds Test     Neer Impingement test    Findings Negative   Side Right     Hawkins-Kennedy test    Findings Negative   Side Right     Full Can test   Findings Positive   Side Right     Speed's test   Findings Negative   Side Right     Speeds test   findings Negative   Side Right                           PT Education - 02/07/16 0650    Education provided Yes   Education Details course of PT, current summary of findings   Person(s) Educated Patient   Methods Explanation;Verbal cues   Comprehension Verbalized understanding             PT Long Term Goals - 02/07/16 1610      PT LONG TERM GOAL #1   Title Pt will demonstrate improvement in LEFS to greater than 50/80 indicating improvement in self reported disability due to knee pain   Baseline <45   Time 6   Period Weeks   Status New     PT LONG TERM GOAL #2   Title Pt will demonstrate improvement in QuickDASH to less than 40/100 indicating improvement in shoulder function   Baseline 50/100   Time 6   Period Weeks   Status New     PT LONG TERM GOAL #3   Title Pt will be I with HEP to improve R shoulder pain free AROM to 150 deg. elevation to be able to put away dishes with RUE   Baseline pain at 90, puts away dishes with LUE   Time 6   Period Weeks   Status New               Plan - 02/07/16 9604    Clinical Impression Statement Pt is a pleasant 70 y/o female with c/o R shoulder and L knee pain. Pain began in shoulder when pt was getting ready for visitors and appears to be primarily muscle related based on pain with AROM, no pain with PROM. Knee pain began when pt was twisting in car. Pt also c/o general deconditioning and would like to know how to exercise safely. Currently pt presents with mild knee pain, poor pelvic/hip control, moderate shoulder pain, muscle weakness and impaired function in R shoulder. Pt does report beginning of L shoulder pain possibly due to extra use of L shoulder due to pain in R.    Rehab Potential Good   Clinical Impairments Affecting Rehab Potential  motivation, relatively acute presentation  PT Frequency 2x / week   PT Duration 6 weeks   PT Treatment/Interventions ADLs/Self Care Home Management;Aquatic Therapy;Therapeutic exercise;Therapeutic activities;Gait training;Neuromuscular re-education;Patient/family education;Dry needling;Manual techniques;Passive range of motion   PT Next Visit Plan aquatic session to begin to learn what exercises to perform in the pool      Patient will benefit from skilled therapeutic intervention in order to improve the following deficits and impairments:  Pain, Improper body mechanics, Impaired flexibility, Hypomobility, Decreased strength, Decreased range of motion, Decreased endurance  Visit Diagnosis: Muscle weakness (generalized)  Pain in left knee  Pain in right shoulder      G-Codes - Feb 10, 2016 0658    Functional Assessment Tool Used LEFS, QuickDASH, AROM   Functional Limitation Carrying, moving and handling objects   Carrying, Moving and Handling Objects Current Status (V4944) At least 20 percent but less than 40 percent impaired, limited or restricted   Carrying, Moving and Handling Objects Goal Status (H6759) At least 1 percent but less than 20 percent impaired, limited or restricted       Problem List Patient Active Problem List   Diagnosis Date Noted  . Postmenopausal bleeding 07/20/2015    Jeramia Saleeby PT DPT 2016/02/10, 8:19 AM  [place] [facility] PHYSICAL AND SPORTS MEDICINE [address] [place], [address] Phone: [phone]   Fax:  [phone]  Name: Brianna Hurst MRN: [id number] Date of Birth: 06-25-1946

## 2016-02-12 ENCOUNTER — Ambulatory Visit: Payer: Medicare Other | Admitting: Physical Therapy

## 2016-02-12 DIAGNOSIS — M6281 Muscle weakness (generalized): Secondary | ICD-10-CM | POA: Diagnosis not present

## 2016-02-12 DIAGNOSIS — M25511 Pain in right shoulder: Secondary | ICD-10-CM

## 2016-02-12 NOTE — Therapy (Signed)
[place] [facility] MEDICINE [address] [place], [address] Phone: [phone]   Fax:  [phone]  Physical Therapy Treatment  Patient Details  Name: Brianna Hurst MRN: [id number] Date of Birth: 03-29-1946 Referring Provider: Artis Flock  Encounter Date: 02/12/2016      PT End of Session - 02/12/16 1141    Visit Number 2   Number of Visits 13   Date for PT Re-Evaluation 03/20/16   Authorization Type g code   PT Start Time 1030   PT Stop Time 1115   PT Time Calculation (min) 45 min   Activity Tolerance Patient tolerated treatment well      Past Medical History:  Diagnosis Date  . Arthritis   . Headache   . Vaginal bleeding    discomfort    Past Surgical History:  Procedure Laterality Date  . DILATATION & CURETTAGE/HYSTEROSCOPY WITH MYOSURE N/A 07/20/2015   Procedure: DILATATION & CURETTAGE/HYSTEROSCOPY WITH MYOSURE;  Surgeon: Nadara Mustard, MD;  Location: [facility] ORS;  Service: Gynecology;  Laterality: N/A;  . HYSTEROSCOPY    . JOINT REPLACEMENT     Lt knee    There were no vitals filed for this visit.      Subjective Assessment - 02/12/16 1040    Subjective Pt reports she had to take care of her mother over the past few days, she has been using her arm more because of this and is having incr. pain.   Pertinent History h/o L knee pain with TKA 2015.    Limitations Sitting;Walking   Patient Stated Goals return to gardening, to get stronger   Currently in Pain? Yes   Pain Score 2    Pain Location Shoulder   Pain Orientation Right              Objective: Green band scapular retractions, pt required extensive cuing for performance of this appropriately with mid trap activation and avoiding shoulder shrug. 3x15  RTB scapular low rows, decr. Cuing necessary but unable to perform with GTB due to pain. Following 3x8 pt c/o pain in biceps tendon.  Performed biceps release with pt performing self stretch   for biceps  Following this pt c/o no pain in shoulder.  Knee flexion  On 2nd step 3x10 performed each side, following this pt reported decr. Pain with stairs. Stiffness in L knee which improved with continued performance/stretching.                  PT Education - 02/12/16 1141    Education provided Yes   Education Details HEP   Person(s) Educated Patient   Methods Explanation   Comprehension Verbalized understanding             PT Long Term Goals - 02/07/16 0655      PT LONG TERM GOAL #1   Title Pt will demonstrate improvement in LEFS to greater than 50/80 indicating improvement in self reported disability due to knee pain   Baseline <45   Time 6   Period Weeks   Status New     PT LONG TERM GOAL #2   Title Pt will demonstrate improvement in QuickDASH to less than 40/100 indicating improvement in shoulder function   Baseline 50/100   Time 6   Period Weeks   Status New     PT LONG TERM GOAL #3   Title Pt will be I with HEP to improve R shoulder pain free AROM to 150 deg.  elevation to be able to put away dishes with RUE   Baseline pain at 90, puts away dishes with LUE   Time 6   Period Weeks   Status New               Plan - 02/12/16 1142    Clinical Impression Statement Pt responded well to session, does have mild biceps pain with shoulder activation which improves with isometric contraction of biceps.. Knee crepitus noted with lunge on stairs. [facility] of [facility] routine for strengthening shoulder, hip, pelvic and core.   Rehab Potential Good   Clinical Impairments Affecting Rehab Potential motivation, relatively acute presentation   PT Frequency 2x / week   PT Duration 6 weeks   PT Treatment/Interventions ADLs/Self Care Home Management;Aquatic Therapy;Therapeutic exercise;Therapeutic activities;Gait training;Neuromuscular re-education;Patient/family education;Dry needling;Manual techniques;Passive range of  motion   PT Next Visit Plan aquatic session to begin to learn what exercises to perform in the pool      Patient will benefit from skilled therapeutic intervention in order to improve the following deficits and impairments:  Pain, Improper body mechanics, Impaired flexibility, Hypomobility, Decreased strength, Decreased range of motion, Decreased endurance  Visit Diagnosis: Muscle weakness (generalized)  Pain in right shoulder     Problem List Patient Active Problem List   Diagnosis Date Noted  . Postmenopausal bleeding 07/20/2015    Adham Johnson PT DPT 02/12/2016, 11:44 AM  [facility]ICINE [address] [place], [place], [address] Phone: [phone]   Fax:  [phone]  Name: Brianna Hurst MRN: [id number] Date of Birth: [birth year]

## 2016-02-14 ENCOUNTER — Ambulatory Visit: Payer: Medicare Other | Admitting: Physical Therapy

## 2016-02-14 DIAGNOSIS — M25511 Pain in right shoulder: Secondary | ICD-10-CM

## 2016-02-14 DIAGNOSIS — M25562 Pain in left knee: Secondary | ICD-10-CM

## 2016-02-14 DIAGNOSIS — M6281 Muscle weakness (generalized): Secondary | ICD-10-CM | POA: Diagnosis not present

## 2016-02-14 NOTE — Therapy (Signed)
[facility] MAIN [facility] SERVICES [address] [place], [place], [address] Phone: [phone]   Fax:  [phone]  Physical Therapy Treatment  Patient Details  Name: Brianna Hurst MRN: [id number] Date of Birth: [birth year] Referring Provider: Artis Flock  Encounter Date: 02/14/2016      PT End of Session - 02/14/16 0930    Visit Number 3   Number of Visits 13   Date for PT Re-Evaluation 03/20/16   Authorization Type g code   PT Start Time 0850   PT Stop Time 0935   PT Time Calculation (min) 45 min   Activity Tolerance Patient tolerated treatment well      Past Medical History:  Diagnosis Date  . Arthritis   . Headache   . Vaginal bleeding    discomfort    Past Surgical History:  Procedure Laterality Date  . DILATATION & CURETTAGE/HYSTEROSCOPY WITH MYOSURE N/A 07/20/2015   Procedure: DILATATION & CURETTAGE/HYSTEROSCOPY WITH MYOSURE;  Surgeon: Nadara Mustard, MD;  Location: [facility] ORS;  Service: Gynecology;  Laterality: N/A;  . HYSTEROSCOPY    . JOINT REPLACEMENT     Lt knee    There were no vitals filed for this visit.      Subjective Assessment - 02/14/16 0929    Subjective Pt reports having questions about her HEP. She noticed she hears "crunching" in her knee cap after performing her knee exercise on the step and then stairclimbing,    Pertinent History h/o L knee pain with TKA 2015.    Limitations Sitting;Walking   Patient Stated Goals return to gardening, to get stronger      O: Pt entered/exited the pool via steps with single UE support on rail.  50 ft =1 lap  Exercises performed in 3'6" depth   Stretches: beginning and at the end Small shoulder circles backwards Hip circles with UE single on rail  Figure 4 stretch Hip flexor stretch   Timing coordination to decrease upper trap overuse in simulated HEP (tactile/verbal cues)  Semi tandem stance for postural stability; Pulling noodle from PT in chest row, exhalation  with upper trap depression and then pulling)  5 reps, each leg forward  Alignment cues for knee flexion ROM on step to keep knee behind toes  Stairclimbing/ lowering with single UE on rail, corrected narrow BOS with cue for wider BOS and propioception 5 steps x 2 sets   2 laps walking forward with noodles , cued for scap retraction, upper trap depression 2 laps walking backward small shoulder ER ~15deg   10 reps of mini squats with noodle behind back  2 laps sidestepping with noodle behind back for 1/2 lap but pt reports shoulder pain. Modified with dumbbells which pt reported was not painful.   4 laps of relaxation on noodles, pulled by PT   Exercises performed in 4'6" depth                 Adult Aquatic Therapy - 02/14/16 0929      Aquatic Therapy Subjective   Subjective Pt reported feeling 10-15% better compared to when she came in.                           PT Long Term Goals - 02/07/16 6962      PT LONG TERM GOAL #1   Title Pt will demonstrate improvement in LEFS to greater than 50/80 indicating improvement in self reported disability due to knee  pain   Baseline <45   Time 6   Period Weeks   Status New     PT LONG TERM GOAL #2   Title Pt will demonstrate improvement in QuickDASH to less than 40/100 indicating improvement in shoulder function   Baseline 50/100   Time 6   Period Weeks   Status New     PT LONG TERM GOAL #3   Title Pt will be I with HEP to improve R shoulder pain free AROM to 150 deg. elevation to be able to put away dishes with RUE   Baseline pain at 90, puts away dishes with LUE   Time 6   Period Weeks   Status New               Plan - 02/14/16 0930    Clinical Impression Statement Pt reported feeling 10-15% better compared to when she came in. Pt showed improved upright posture with cuing for scapular and cervical retraction throughout exercises. One exercise was modified due to shoulder pain and pt was able  to tolerate isometric strengthening of posterior midback mm. Pt showed good carry over with wider BOS with stairclimbing post Tx. Pt reported feeling a new sensation at her L knee but no pain. Pt will continue to benefit from skilled PT.    Rehab Potential Good   Clinical Impairments Affecting Rehab Potential motivation, relatively acute presentation   PT Frequency 2x / week   PT Duration 6 weeks   PT Treatment/Interventions ADLs/Self Care Home Management;Aquatic Therapy;Therapeutic exercise;Therapeutic activities;Gait training;Neuromuscular re-education;Patient/family education;Dry needling;Manual techniques;Passive range of motion   PT Next Visit Plan aquatic session to begin to learn what exercises to perform in the pool      Patient will benefit from skilled therapeutic intervention in order to improve the following deficits and impairments:  Pain, Improper body mechanics, Impaired flexibility, Hypomobility, Decreased strength, Decreased range of motion, Decreased endurance  Visit Diagnosis: Muscle weakness (generalized)  Pain in left knee  Pain in right shoulder     Problem List Patient Active Problem List   Diagnosis Date Noted  . Postmenopausal bleeding 07/20/2015    Mariane MastersYeung,Shin Yiing ,PT, DPT, E-RYT  02/14/2016, 9:43 AM  [facility] [address] , [place], [address] Phone: [phone]   Fax:  [phone]  Name: Brianna Hurst MRN: [id number] Date of Birth: 04-03-1946

## 2016-02-19 ENCOUNTER — Ambulatory Visit: Payer: Medicare Other | Admitting: Physical Therapy

## 2016-02-19 DIAGNOSIS — M6281 Muscle weakness (generalized): Secondary | ICD-10-CM | POA: Diagnosis not present

## 2016-02-19 DIAGNOSIS — M25511 Pain in right shoulder: Secondary | ICD-10-CM

## 2016-02-19 DIAGNOSIS — M25562 Pain in left knee: Secondary | ICD-10-CM

## 2016-02-19 NOTE — Therapy (Signed)
[facility] [facility] [address] [place], [place], [address] Phone: [phone]   Fax:  [phone]  Physical Therapy Treatment  Patient Details  Name: Brianna Hurst MRN: [id number] Date of Birth: Feb 15, 1946 Referring Provider: Artis FlockWolfe  Encounter Date: 02/19/2016      PT End of Session - 02/19/16 0857    Visit Number 4   Number of Visits 13   Date for PT Re-Evaluation 03/20/16   Authorization Type g code   PT Start Time 0845   PT Stop Time 0945   PT Time Calculation (min) 60 min   Activity Tolerance Patient tolerated treatment well;No increased pain   Behavior During Therapy WFL for tasks assessed/performed      Past Medical History:  Diagnosis Date  . Arthritis   . Headache   . Vaginal bleeding    discomfort    Past Surgical History:  Procedure Laterality Date  . DILATATION & CURETTAGE/HYSTEROSCOPY WITH MYOSURE N/A 07/20/2015   Procedure: DILATATION & CURETTAGE/HYSTEROSCOPY WITH MYOSURE;  Surgeon: Nadara Mustardobert P Harris, MD;  Location: [facility] ORS;  Service: Gynecology;  Laterality: N/A;  . HYSTEROSCOPY    . JOINT REPLACEMENT     Lt knee    There were no vitals filed for this visit.      Subjective Assessment - 02/19/16 0852    Subjective Pt reported she felt tired but not sore. Pt states the genral ache is there most of the time. Depending on her activities, she had some pain 4-5/10 briefly.  (i.e cooking , cutting at her counter, reaching back to tuck shirt in, or get something out of her pocket).  Pt reports she is sleeping more but she still getting into a comfortable position.  Pt has found the modifications to her HEP to be helpful in maintaining upright posture.                       Adult Aquatic Therapy - 02/19/16 0857      Aquatic Therapy Subjective   Subjective Pt had no complaints        O: Pt entered/exited the pool via steps with single UE support on rail.  50 ft =1 lap  Exercises  performed in 3'6" depth   Stretches:  Hip abd circles  Trunk rotation (minimal shoulder ER/abd due to pain)  Side bend (cued for decreased upper trap activation on rise)   Mini squats 10 reps (cued for knees behind toes)    PT demo'd proper log rolling technique on land. Strategized with pt to log roll onto L side, arm over head towards middle of bed, and use heels and triceps to turn lower body around to get out of bed..  Pt reported she rolled onto her R shoulder to get of out bed and did not raise it overhead with side lying. Discussed the use of body pillow and additional small pillow  to provide support for shoulder to minimize IR.  Pt voiced understanding   walking backwards  4 laps with noodles held at its end, small shoulder ER (10-15 deg)  with cue for elbows by side and scap/ cervical retraction retraction (pt reported soreness over suprasupinatus area by 4th lap)   Strategized about chopping vegetables because her countertop is too high. Suggested using a cart on wheels to transport items to dinner table for chopping. Use sink hose to bring water closer to her and the item to wash to minimize forward reaching.  Pt voiced understanding   Isometric shoulder ext (elb bent, 90 -90 deg elbow to shoulder) and scap dpression/retraction 5 sec holds against wall rail. Cued for knees bent to minimize hyperextension of knees  10 reps    2 laps of floating on noodles, guided relaxation , pulled by PT                PT Long Term Goals - 02/07/16 0655      PT LONG TERM GOAL #1   Title Pt will demonstrate improvement in LEFS to greater than 50/80 indicating improvement in self reported disability due to knee pain   Baseline <45   Time 6   Period Weeks   Status New     PT LONG TERM GOAL #2   Title Pt will demonstrate improvement in QuickDASH to less than 40/100 indicating improvement in shoulder function   Baseline 50/100   Time 6   Period Weeks   Status New     PT  LONG TERM GOAL #3   Title Pt will be I with HEP to improve R shoulder pain free AROM to 150 deg. elevation to be able to put away dishes with RUE   Baseline pain at 90, puts away dishes with LUE   Time 6   Period Weeks   Status New               Plan - 02/19/16 [id number]    Clinical Impression Statement Pt demo'd good carry over with upright posture but continued to require cues for decreased upper trap activation. Pt demo'd proper alignment and techinque with tactile and verbal cues. Progressed to isometric strengthening of scapular stabilizer mm. Educated pt techniques to log roll and cook at counter to minimize shoulder pain. Pt voiced understanding. Pt continues to benefit from skilled PT.    Rehab Potential Good   Clinical Impairments Affecting Rehab Potential motivation, relatively acute presentation   PT Frequency 2x / week   PT Duration 6 weeks   PT Treatment/Interventions ADLs/Self Care Home Management;Aquatic Therapy;Therapeutic exercise;Therapeutic activities;Gait training;Neuromuscular re-education;Patient/family education;Dry needling;Manual techniques;Passive range of motion   PT Next Visit Plan aquatic session to begin to learn what exercises to perform in the pool      Patient will benefit from skilled therapeutic intervention in order to improve the following deficits and impairments:  Pain, Improper body mechanics, Impaired flexibility, Hypomobility, Decreased strength, Decreased range of motion, Decreased endurance  Visit Diagnosis: Muscle weakness (generalized)  Pain in left knee  Pain in right shoulder     Problem List Patient Active Problem List   Diagnosis Date Noted  . Postmenopausal bleeding 07/20/2015    Mariane MastersYeung,Shin Yiing ,PT, DPT, E-RYT  02/19/2016, 9:20 AM  [facility] [address] [place], [place], [address] Phone: [phone]   Fax:  [phone]  Name: Brianna Hurst MRN:  [id number] Date of Birth: [birth year]

## 2016-02-26 ENCOUNTER — Ambulatory Visit: Payer: Medicare Other | Admitting: Physical Therapy

## 2016-02-26 DIAGNOSIS — M6281 Muscle weakness (generalized): Secondary | ICD-10-CM | POA: Diagnosis not present

## 2016-02-26 DIAGNOSIS — M25562 Pain in left knee: Secondary | ICD-10-CM

## 2016-02-26 DIAGNOSIS — M25511 Pain in right shoulder: Secondary | ICD-10-CM

## 2016-02-26 NOTE — Therapy (Signed)
[place] [facility] [address] [place], [place], [address] Phone: [phone]   Fax:  [phone]  Physical Therapy Treatment  Patient Details  Name: Brianna Hurst Lemen MRN: [id number] Date of Birth: 06/07/1946 Referring Provider: Artis FlockWolfe  Encounter Date: 02/26/2016      PT End of Session - 02/26/16 0830    Visit Number 5   Number of Visits 13   Date for PT Re-Evaluation 03/20/16   Authorization Type g code   PT Start Time 0800   PT Stop Time 0845   PT Time Calculation (min) 45 min   Activity Tolerance Patient tolerated treatment well;No increased pain   Behavior During Therapy WFL for tasks assessed/performed      Past Medical History:  Diagnosis Date  . Arthritis   . Headache   . Vaginal bleeding    discomfort    Past Surgical History:  Procedure Laterality Date  . DILATATION & CURETTAGE/HYSTEROSCOPY WITH MYOSURE N/A 07/20/2015   Procedure: DILATATION & CURETTAGE/HYSTEROSCOPY WITH MYOSURE;  Surgeon: Nadara Mustardobert P Harris, MD;  Location: [facility] ORS;  Service: Gynecology;  Laterality: N/A;  . HYSTEROSCOPY    . JOINT REPLACEMENT     Lt knee    There were no vitals filed for this visit.      Subjective Assessment - 02/26/16 0807    Subjective Pt continues to feel a sharp pain in R shoulder when reaching behind her back and more so now when reaching overhead. Pt modifies with use of L hand. She reaches overhead repeated throughout the day  for plates and glasses, and with microwaving food. She used a step stool to modify as well. Pt reports she feels she had 4 days after last session when her shoulder felt better but since moving boxes at head height on Saturday, she has felt a relapse but no increased pain.  Currently her shoulder is 3/10.        O: Pt entered/exited the pool via steps with single UE support on rail.  50 ft =1 lap   Exercises performed in 4'0" depth   4 laps side stepping with shoulder abd 20-30 deg B  with cuing for feet propioception / equal  weight bearing Isometric scapular/ cervical retraction with elbow extended 5 sec holds, 5 x   Relaxation of wrist and hands small figure 8 in the water  Semi tandem stance with functional activities for stability and less overuse of arms : Practiced pulling fridge door with proper mechanics, weight shift, minimal shoulder ext  5 reps B Practiced pushing as above  Practiced tucking shirt in with trunk rotation B   Relaxation on noodles 5'   Educated on bringing down plates/ cups from cabinet to counter to minimize repeated overhead reaching Educated on proper sitting posture with weightbearing through feet and pelvis to realign head over trunk (decreasing forward head)                   Adult Aquatic Therapy - 02/26/16 0820      Aquatic Therapy Subjective   Subjective Pt had no complaints                     PT Education - 02/26/16 0821    Education provided Yes   Education Details modifying ADLs, relocating dishes and plates from higher shelf to counter level, changing position to face counter perpendicularly when scrubbing counter to minimize overuse of anterior RTC mm.  PT Long Term Goals - 02/07/16 [id number]      PT LONG TERM GOAL #1   Title Pt will demonstrate improvement in LEFS to greater than 50/80 indicating improvement in self reported disability due to knee pain   Baseline <45   Time 6   Period Weeks   Status New     PT LONG TERM GOAL #2   Title Pt will demonstrate improvement in QuickDASH to less than 40/100 indicating improvement in shoulder function   Baseline 50/100   Time 6   Period Weeks   Status New     PT LONG TERM GOAL #3   Title Pt will be I with HEP to improve R shoulder pain free AROM to 150 deg. elevation to be able to put away dishes with RUE   Baseline pain at 90, puts away dishes with LUE   Time 6   Period Weeks   Status New               Plan -  02/26/16 0830    Clinical Impression Statement Pt experienced improvements until she handled boxes from a shelf at face height this past Saturday. Also she reported repeated overhead reaching with dishes from a cabinet or using microwave.  After pool therapy today plus body mechanics education, her pain decreased from 3/10 to 2/10. Provided education on  relocating her plates to counter height in order to minimize repeated overhead reaching/ lifting/ lowering until she gains strength in her RTC and shoulder mm. Her ability to tuck her shirt as [facility], as she still feels sharp pain with shoulder ext. Incorporating slight trunk rotation w/ the task decreased some of the pain and to use her L hand with proper mechanics as much as possible this week while R shoulder rests. Continued to  withhold shoulder ext exercises and overhead exercises. Communicated with referred PT via email on her progress. Pt will continue to benefit from skilled PT.     Rehab Potential Good   Clinical Impairments Affecting Rehab Potential motivation, relatively acute presentation   PT Frequency 2x / week   PT Duration 6 weeks   PT Treatment/Interventions ADLs/Self Care Home Management;Aquatic Therapy;Therapeutic exercise;Therapeutic activities;Gait training;Neuromuscular re-education;Patient/family education;Dry needling;Manual techniques;Passive range of motion   PT Next Visit Plan aquatic session to begin to learn what exercises to perform in the pool      Patient will benefit from skilled therapeutic intervention in order to improve the following deficits and impairments:  Pain, Improper body mechanics, Impaired flexibility, Hypomobility, Decreased strength, Decreased range of motion, Decreased endurance  Visit Diagnosis: Muscle weakness (generalized)  Pain in left knee  Pain in right shoulder     Problem List Patient Active Problem List   Diagnosis Date Noted  . Postmenopausal bleeding 07/20/2015     Mariane MastersYeung,Shin Yiing ,PT, DPT, E-RYT  02/26/2016, 8:31 AM  [place] [facility] [address] [place], [place], [address] Phone: [phone]   Fax:  [phone]  Name: Brianna Hurst Russum MRN: [id number] Date of Birth: June 19, 1946

## 2016-02-28 ENCOUNTER — Ambulatory Visit: Payer: Medicare Other | Admitting: Physical Therapy

## 2016-02-28 DIAGNOSIS — M25511 Pain in right shoulder: Secondary | ICD-10-CM

## 2016-02-28 DIAGNOSIS — M25562 Pain in left knee: Secondary | ICD-10-CM

## 2016-02-28 DIAGNOSIS — M6281 Muscle weakness (generalized): Secondary | ICD-10-CM | POA: Diagnosis not present

## 2016-02-29 NOTE — Therapy (Signed)
[place] [facility] [address], [place], [address] Phone: [phone]   Fax:  [phone]  Physical Therapy Treatment  Patient Details  Name: ALIZZON DIOGUARDI MRN: [id number] Date of Birth: 1945/08/27 Referring Provider: Artis Flock  Encounter Date: 02/28/2016      PT End of Session - 02/28/16 0804    Visit Number 6   Number of Visits 13   Date for PT Re-Evaluation 03/20/16   Authorization Type g code   PT Start Time 1118   PT Stop Time 1200   PT Time Calculation (min) 42 min   Activity Tolerance Patient tolerated treatment well;No increased pain   Behavior During Therapy WFL for tasks assessed/performed      Past Medical History:  Diagnosis Date  . Arthritis   . Headache   . Vaginal bleeding    discomfort    Past Surgical History:  Procedure Laterality Date  . DILATATION & CURETTAGE/HYSTEROSCOPY WITH MYOSURE N/A 07/20/2015   Procedure: DILATATION & CURETTAGE/HYSTEROSCOPY WITH MYOSURE;  Surgeon: Nadara Mustard, MD;  Location: [facility] ORS;  Service: Gynecology;  Laterality: N/A;  . HYSTEROSCOPY    . JOINT REPLACEMENT     Lt knee    There were no vitals filed for this visit.      Subjective Assessment - 02/28/16 1120    Subjective Pain is improving in shoulder, and pt reports L knee feels "tight" today and crepitus has incr.   Currently in Pain? Yes   Pain Score 2    Pain Location Shoulder            Objective: Extensive exploration of pain with reaching tasks, utilized sliding hand along thigh, hand along thigh and then reaching "through" (to unsupported), then adding lift.  With reaching pt has no pain, with lift, mild pain. Continued cuing to activate scapular protractors which improved scapular control and decr. Pain. Extensive practice of this. Still had mild pain with lift.  STM performed on R deltoid including compression, petrissage, cross friction massage.  Following this performed  second round of lifting with notably improved pain. Pain at rest still present. PT encouraged pt to continue to perform scapular exercises at home and explained how to monitor pain related to this.                     PT Education - 02/28/16 0804    Education provided Yes   Education Details strategies for decr. pain in shoulder.   Person(s) Educated Patient   Methods Explanation   Comprehension Verbalized understanding             PT Long Term Goals - 02/07/16 1324      PT LONG TERM GOAL #1   Title Pt will demonstrate improvement in LEFS to greater than 50/80 indicating improvement in self reported disability due to knee pain   Baseline <45   Time 6   Period Weeks   Status New     PT LONG TERM GOAL #2   Title Pt will demonstrate improvement in QuickDASH to less than 40/100 indicating improvement in shoulder function   Baseline 50/100   Time 6   Period Weeks   Status New     PT LONG TERM GOAL #3   Title Pt will be I with HEP to improve R shoulder pain free AROM to 150 deg. elevation to be able to put away dishes with RUE   Baseline pain at 90, puts  away dishes with LUE   Time 6   Period Weeks   Status New               Plan - 02/28/16 0805    Clinical Impression Statement Able to decr. pain in shoulder with STM performed on R deltoid, modifying strategy for reaching. Encouraged pt to discontinue knee flexion stretch for 4 days to assess whether crepitus decreases with this, also encouraged to elevate and ice knee.    Rehab Potential Good   Clinical Impairments Affecting Rehab Potential motivation, relatively acute presentation   PT Frequency 2x / week   PT Duration 6 weeks   PT Treatment/Interventions ADLs/Self Care Home Management;Aquatic Therapy;Therapeutic exercise;Therapeutic activities;Gait training;Neuromuscular re-education;Patient/family education;Dry needling;Manual techniques;Passive range of motion   PT Next Visit Plan aquatic session  to begin to learn what exercises to perform in the pool      Patient will benefit from skilled therapeutic intervention in order to improve the following deficits and impairments:  Pain, Improper body mechanics, Impaired flexibility, Hypomobility, Decreased strength, Decreased range of motion, Decreased endurance  Visit Diagnosis: Pain in right shoulder  Pain in left knee     Problem List Patient Active Problem List   Diagnosis Date Noted  . Postmenopausal bleeding 07/20/2015    Rishan Oyama PT DPT 02/29/2016, 8:13 AM  [place] [facility] [address] [place], [place], [address] Phone: [phone]   Fax:  [phone]  Name: Verlan Friendsatsy H Breunig MRN: [id number] Date of Birth: [birth year]

## 2016-03-04 ENCOUNTER — Ambulatory Visit: Payer: Medicare Other | Admitting: Physical Therapy

## 2016-03-04 DIAGNOSIS — M6281 Muscle weakness (generalized): Secondary | ICD-10-CM | POA: Diagnosis not present

## 2016-03-04 DIAGNOSIS — M25511 Pain in right shoulder: Secondary | ICD-10-CM

## 2016-03-05 NOTE — Therapy (Signed)
[place] [facility]TS MEDICINE [address], [place], [address] Phone: [phone]   Fax:  [phone]  Physical Therapy Treatment  Patient Details  Name: Brianna Hurst MRN: [id number] Date of Birth: [birth year] Referring Provider: Artis Flock  Encounter Date: 03/04/2016      PT End of Session - 03/04/16 0630    Visit Number 7   Number of Visits 13   Date for PT Re-Evaluation 03/20/16   Authorization Type g code   PT Start Time 1430   PT Stop Time 1510   PT Time Calculation (min) 40 min   Activity Tolerance Patient tolerated treatment well;No increased pain   Behavior During Therapy WFL for tasks assessed/performed      Past Medical History:  Diagnosis Date  . Arthritis   . Headache   . Vaginal bleeding    discomfort    Past Surgical History:  Procedure Laterality Date  . DILATATION & CURETTAGE/HYSTEROSCOPY WITH MYOSURE N/A 07/20/2015   Procedure: DILATATION & CURETTAGE/HYSTEROSCOPY WITH MYOSURE;  Surgeon: Nadara Mustard, MD;  Location: [facility]S;  Service: Gynecology;  Laterality: N/A;  . HYSTEROSCOPY    . JOINT REPLACEMENT     Lt knee    There were no vitals filed for this visit.      Subjective Assessment - 03/04/16 1433    Subjective Pt reports pain has improved, she reports "improved technique" in her exercise which has resulted in her pain level decreasing with activity. She does report mild pain when getting into bed and would like to address this               Objective: Extensive STM performed on R arm, focusing on biceps, deltoid, triceps. Utilized effleurage, petrissage, compression, and cross friction massage.  Following this pt reported 0/10 pain in shoulder with elevation to 90 deg., mild pain above this point.   Issued and had pt perform deltoid stretch (arm across body) 3x 30 sec, with additional time for cuing/correcting posture.  Also had pt perform slow 2# bicep curl going to full  extension to address biceps release following STM.  Modified exercise issued by aquatic therapist to perform scapular retractions into wall to performing same in wingback or supported chair to minimize discomfort which pt was having with performance.                         PT Long Term Goals - 02/07/16 6962      PT LONG TERM GOAL #1   Title Pt will demonstrate improvement in LEFS to greater than 50/80 indicating improvement in self reported disability due to knee pain   Baseline <45   Time 6   Period Weeks   Status New     PT LONG TERM GOAL #2   Title Pt will demonstrate improvement in QuickDASH to less than 40/100 indicating improvement in shoulder function   Baseline 50/100   Time 6   Period Weeks   Status New     PT LONG TERM GOAL #3   Title Pt will be I with HEP to improve R shoulder pain free AROM to 150 deg. elevation to be able to put away dishes with RUE   Baseline pain at 90, puts away dishes with LUE   Time 6   Period Weeks   Status New               Plan - 03/05/16 9528  Clinical Impression Statement Pain improved with continued STM, pt has minimal pain at this time but would benefit from continued treatment of deltoid area pain. Did not treat knee today as focus of session on addressing continued shoulder dysfunction.   Rehab Potential Good   Clinical Impairments Affecting Rehab Potential motivation, relatively acute presentation   PT Frequency 2x / week   PT Duration 6 weeks   PT Treatment/Interventions ADLs/Self Care Home Management;Aquatic Therapy;Therapeutic exercise;Therapeutic activities;Gait training;Neuromuscular re-education;Patient/family education;Dry needling;Manual techniques;Passive range of motion   PT Next Visit Plan aquatic session to begin to learn what exercises to perform in the pool      Patient will benefit from skilled therapeutic intervention in order to improve the following deficits and impairments:  Pain,  Improper body mechanics, Impaired flexibility, Hypomobility, Decreased strength, Decreased range of motion, Decreased endurance  Visit Diagnosis: Pain in right shoulder     Problem List Patient Active Problem List   Diagnosis Date Noted  . Postmenopausal bleeding 07/20/2015    Shanine Kreiger PT DPT 03/05/2016, 6:32 AM  [place] [facility] [address] [place], [place], [address] Phone: [phone]   Fax:  [phone]  Name: Brianna Hurst MRN: [id number] Date of Birth: 06/28/1946

## 2016-03-11 ENCOUNTER — Ambulatory Visit: Payer: Medicare Other | Attending: Obstetrics & Gynecology | Admitting: Physical Therapy

## 2016-03-11 DIAGNOSIS — M25562 Pain in left knee: Secondary | ICD-10-CM | POA: Insufficient documentation

## 2016-03-11 DIAGNOSIS — M6281 Muscle weakness (generalized): Secondary | ICD-10-CM | POA: Insufficient documentation

## 2016-03-11 DIAGNOSIS — M25511 Pain in right shoulder: Secondary | ICD-10-CM | POA: Diagnosis present

## 2016-03-12 NOTE — Therapy (Addendum)
[place] [facility] [address] [place], [place], [address] Phone: [phone]   Fax:  [phone]  Physical Therapy Treatment  Patient Details  Name: Brianna Hurst MRN: [id number] Date of Birth: 1945/11/17 Referring Provider: Artis FlockWolfe  Encounter Date: 03/11/2016      PT End of Session - 03/12/16 2258    Visit Number 8   Number of Visits 13   Date for PT Re-Evaluation 03/20/16   Authorization Type g code   PT Start Time 0945   PT Stop Time 1025   PT Time Calculation (min) 40 min   Activity Tolerance Patient tolerated treatment well;No increased pain   Behavior During Therapy WFL for tasks assessed/performed      Past Medical History:  Diagnosis Date  . Arthritis   . Headache   . Vaginal bleeding    discomfort    Past Surgical History:  Procedure Laterality Date  . DILATATION & CURETTAGE/HYSTEROSCOPY WITH MYOSURE N/A 07/20/2015   Procedure: DILATATION & CURETTAGE/HYSTEROSCOPY WITH MYOSURE;  Surgeon: Nadara Mustardobert P Harris, MD;  Location: [facility] ORS;  Service: Gynecology;  Laterality: N/A;  . HYSTEROSCOPY    . JOINT REPLACEMENT     Lt knee    There were no vitals filed for this visit.      Subjective Assessment - 03/12/16 2245    Subjective Pt reported her shoulder is feeling better with her past land PT treatments. Pt reported she had a question about one of her new exercises (arm slide on wall)  because she felt pain when she lowered her arm once she completed the exercise.  Pt reported she was able to walk many steps during     Pertinent History h/o L knee pain with TKA 2015.    Limitations Sitting;Walking   Patient Stated Goals return to gardening, to get stronger                     Adult Aquatic Therapy - 03/12/16 2311      Aquatic Therapy Subjective   Subjective Pt had no complaints   pt verbalized soreness but no pain over deltoid area post Tx        O: Pt entered/exited the pool via steps with  single UE support on rail.  50 ft =1 lap  Exercises performed in 4' depth  Seated on bench: UE ROM exercises Isometric L-sit on bench 5 sec holds x 10 , fingers pointed anteriorly with shoulder ER ~ 15 deg    2 laps (L/R) Sidestepping with shoulder abduction ~30 deg  2 laps (L/R) Sidestepping with hip abduction (supination/ pronation)  Isometric  ~60 deg against PT resistance 5 sec hold x 5  2 laps (l/R) sidestepping shoulder  abduction ~60 deg  2 laps Floating on noodles for relaxation with shoulder ER/abd, fingers relaxed , PT supports and pulled through the water                      PT Long Term Goals - 02/07/16 [id number]      PT LONG TERM GOAL #1   Title Pt will demonstrate improvement in LEFS to greater than 50/80 indicating improvement in self reported disability due to knee pain   Baseline <45   Time 6   Period Weeks   Status New     PT LONG TERM GOAL #2   Title Pt will demonstrate improvement in QuickDASH to less than 40/100 indicating improvement in shoulder function  Baseline 50/100   Time 6   Period Weeks   Status New     PT LONG TERM GOAL #3   Title Pt will be I with HEP to improve R shoulder pain free AROM to 150 deg. elevation to be able to put away dishes with RUE   Baseline pain at 90, puts away dishes with LUE   Time 6   Period Weeks   Status New               Plan - 03/12/16 2259    Clinical Impression Statement Pt tolerated aquatic Tx without complaints. Pt showed improved posture with minimal cuing.  Pt was educated using her scapular stabilizing muscles in order to minimize pain when lowering her arm with the wall arm slide exercise (HEP exercise given at her last land appt). Pt demo'd understanding and voiced no pain after this cuing.  Pt continues to benefit from skilled PT.     Rehab Potential Good   Clinical Impairments Affecting Rehab Potential motivation, relatively acute presentation   PT Frequency 2x / week   PT Duration 6  weeks   PT Treatment/Interventions ADLs/Self Care Home Management;Aquatic Therapy;Therapeutic exercise;Therapeutic activities;Gait training;Neuromuscular re-education;Patient/family education;Dry needling;Manual techniques;Passive range of motion   PT Next Visit Plan aquatic session to begin to learn what exercises to perform in the pool      Patient will benefit from skilled therapeutic intervention in order to improve the following deficits and impairments:  Pain, Improper body mechanics, Impaired flexibility, Hypomobility, Decreased strength, Decreased range of motion, Decreased endurance  Visit Diagnosis: Pain in right shoulder  Pain in left knee  Muscle weakness (generalized)     Problem List Patient Active Problem List   Diagnosis Date Noted  . Postmenopausal bleeding 07/20/2015    Mariane Masters ,PT, DPT, E-RYT  03/12/2016, 11:12 PM  [place] [facility] SERVICES [address] [place], [place], [address] Phone: [phone]   Fax:  [phone]  Name: Brianna Hurst MRN: [id number] Date of Birth: July 23, 1945

## 2016-03-13 ENCOUNTER — Ambulatory Visit: Payer: Medicare Other | Admitting: Physical Therapy

## 2016-03-13 DIAGNOSIS — M25562 Pain in left knee: Secondary | ICD-10-CM

## 2016-03-13 DIAGNOSIS — M25511 Pain in right shoulder: Secondary | ICD-10-CM | POA: Diagnosis not present

## 2016-03-13 DIAGNOSIS — M6281 Muscle weakness (generalized): Secondary | ICD-10-CM

## 2016-03-13 NOTE — Therapy (Signed)
[place] [facility] MAIN [facility] SERVICES [address] [place], [place], [address] Phone: [phone]   Fax:  [phone]  Physical Therapy Treatment  Patient Details  Name: LORRETTA KERCE MRN: [id number] Date of Birth: 09-11-1945 Referring Provider: Artis Flock  Encounter Date: 03/13/2016      PT End of Session - 03/13/16 1155    Visit Number 9   Number of Visits 13   Date for PT Re-Evaluation 03/20/16   Authorization Type g code   PT Start Time 0950   PT Stop Time 1030   PT Time Calculation (min) 40 min   Activity Tolerance Patient tolerated treatment well;No increased pain   Behavior During Therapy WFL for tasks assessed/performed      Past Medical History:  Diagnosis Date  . Arthritis   . Headache   . Vaginal bleeding    discomfort    Past Surgical History:  Procedure Laterality Date  . DILATATION & CURETTAGE/HYSTEROSCOPY WITH MYOSURE N/A 07/20/2015   Procedure: DILATATION & CURETTAGE/HYSTEROSCOPY WITH MYOSURE;  Surgeon: Nadara Mustard, MD;  Location: [facility] ORS;  Service: Gynecology;  Laterality: N/A;  . HYSTEROSCOPY    . JOINT REPLACEMENT     Lt knee    There were no vitals filed for this visit.      Subjective Assessment - 03/13/16 1154    Subjective Pt had no complaints with her shoulder following last session. Pt did notice some stiffness in inside surface of her knees following the session.    Pertinent History h/o L knee pain with TKA 2015.    Limitations Sitting;Walking   Patient Stated Goals return to gardening, to get stronger                     Adult Aquatic Therapy - 03/13/16 1155      Aquatic Therapy Subjective   Subjective Pt had no complaints        O: Pt entered/exited the pool via steps with single UE support on rail.  50 ft =1 lap  Exercises performed in 4' depth   ROM stretches at UE/ LE joints  figure 8 with wrist and elbows   2 laps sidestepping + shoulder abd ~30 deg, hands  cupped,fingers adducted,  with cue for equal weight bearing of transverse arch/ slight hip ER to minimize medial knee complaints)   2 laps sidestepping + shoulder abd ~60 deg,  hands cupped,fingers adducted,  with cue for equal weight bearing of transverse arch/ slight hip ER to minimize medial knee complaints)   Mini squat with figure 8 with wrist and elbows, accordian playing movement 10 reps each     isometric lats push downs on bench 5 sec x 10  isometric retractions on bench 5 sec x 10  (noodles placed behind back to maintain alignment of spine)    Floating on noodles, supported by PT,  2 laps snow angel movements in UE/LE small range   Relaxation on noodles, pulled by PT,  Guided pt on transitioning to stand with minimal strain on neck and back.                       PT Long Term Goals - 02/07/16 9528      PT LONG TERM GOAL #1   Title Pt will demonstrate improvement in LEFS to greater than 50/80 indicating improvement in self reported disability due to knee pain   Baseline <45   Time 6  Period Weeks   Status New     PT LONG TERM GOAL #2   Title Pt will demonstrate improvement in QuickDASH to less than 40/100 indicating improvement in shoulder function   Baseline 50/100   Time 6   Period Weeks   Status New     PT LONG TERM GOAL #3   Title Pt will be I with HEP to improve R shoulder pain free AROM to 150 deg. elevation to be able to put away dishes with RUE   Baseline pain at 90, puts away dishes with LUE   Time 6   Period Weeks   Status New               Plan - 03/13/16 1157    Clinical Impression Statement Pt had no complaints today with her exercises related to her shoulder. Focused on ROM movements at depth with R shoulder submerged to provide resistance from water.  Cued pt on slight hip ER and equal weight bearing across transverse arch with wide BOS stance in sidestepping exercise to minimize her c/o stiffness in her knees. Pt reported no  increased pain in knees but was aware of having used muscles of R knee.  Pt would like continue incorporating aquatic therapy into future sessions. PT asked pt to discuss with treating PT about her POC. Pt voiced understanding.    Rehab Potential Good   Clinical Impairments Affecting Rehab Potential motivation, relatively acute presentation   PT Frequency 2x / week   PT Duration 6 weeks   PT Treatment/Interventions ADLs/Self Care Home Management;Aquatic Therapy;Therapeutic exercise;Therapeutic activities;Gait training;Neuromuscular re-education;Patient/family education;Dry needling;Manual techniques;Passive range of motion   Consulted and Agree with Plan of Care Patient      Patient will benefit from skilled therapeutic intervention in order to improve the following deficits and impairments:  Pain, Improper body mechanics, Impaired flexibility, Hypomobility, Decreased strength, Decreased range of motion, Decreased endurance  Visit Diagnosis: Pain in right shoulder  Pain in left knee  Muscle weakness (generalized)     Problem List Patient Active Problem List   Diagnosis Date Noted  . Postmenopausal bleeding 07/20/2015    Mariane MastersYeung,Shin Yiing ,PT, DPT, E-RYT  03/13/2016, 12:15 PM  [facility] MAIN [facility] [address] [place], [place], [address] Phone: [phone]   Fax:  [phone]  Name: Verlan Friendsatsy H Marston MRN: [id number] Date of Birth: Aug 03, 1945

## 2016-03-18 ENCOUNTER — Ambulatory Visit: Payer: Medicare Other | Admitting: Physical Therapy

## 2016-03-18 DIAGNOSIS — M25511 Pain in right shoulder: Secondary | ICD-10-CM | POA: Diagnosis not present

## 2016-03-18 DIAGNOSIS — M6281 Muscle weakness (generalized): Secondary | ICD-10-CM

## 2016-03-18 NOTE — Therapy (Signed)
[place] [facility] [address] [place], [address] Phone: [phone]   Fax:  [phone]  Physical Therapy Treatment  Patient Details  Name: Brianna Hurst MRN: [id number] Date of Birth: [birth year] Referring Provider: Artis Flock  Encounter Date: 03/18/2016      PT End of Session - 03/18/16 1556    Visit Number 10   Number of Visits 13   Date for PT Re-Evaluation 04/29/16   Authorization Type g code   PT Start Time 1115   PT Stop Time 1155   PT Time Calculation (min) 40 min   Activity Tolerance Patient tolerated treatment well;No increased pain   Behavior During Therapy WFL for tasks assessed/performed      Past Medical History:  Diagnosis Date  . Arthritis   . Headache   . Vaginal bleeding    discomfort    Past Surgical History:  Procedure Laterality Date  . DILATATION & CURETTAGE/HYSTEROSCOPY WITH MYOSURE N/A 07/20/2015   Procedure: DILATATION & CURETTAGE/HYSTEROSCOPY WITH MYOSURE;  Surgeon: Nadara Mustard, MD;  Location: [facility] ORS;  Service: Gynecology;  Laterality: N/A;  . HYSTEROSCOPY    . JOINT REPLACEMENT     Lt knee    There were no vitals filed for this visit.      Subjective Assessment - 03/18/16 1554    Subjective Pt reports she is overall doing better, minimal pain currently but she is still uncomfortable working out on her own.   Pertinent History h/o L knee pain with TKA 2015.    Limitations Sitting;Walking   Patient Stated Goals return to gardening, to get stronger   Currently in Pain? Yes   Pain Score 1    Pain Location Shoulder            Objective:                     PT Education - 03/18/16 1556    Education provided Yes   Education Details additional PT needs   Person(s) Educated Patient   Methods Explanation   Comprehension Verbalized understanding             PT Long Term Goals - 03/18/16 1557      PT LONG TERM GOAL #1   Title Pt  will demonstrate improvement in LEFS to greater than 50/80 indicating improvement in self reported disability due to knee pain   Baseline <45   Time 6   Period Weeks   Status Achieved     PT LONG TERM GOAL #2   Title Pt will demonstrate improvement in QuickDASH to less than 40/100 indicating improvement in shoulder function   Baseline 50/100   Time 6   Period Weeks   Status Not Met     PT LONG TERM GOAL #3   Title Pt will be I with HEP to improve R shoulder pain free AROM to 150 deg. elevation to be able to put away dishes with RUE   Baseline pain at 90, puts away dishes with LUE   Time 6   Period Weeks   Status Unable to assess               Plan - 03/18/16 1557    Clinical Impression Statement Pt has met knee goals, has not yet met shoulder goals. Pt is still having mild pain with overhead activity and is limited in strength related activities in RUE. Would benefit from short addiitional bout of PT  to ensure she is I with HEP, to progress to primarily land based treatment and to improve shoulder ROM and strength.   Rehab Potential Good   Clinical Impairments Affecting Rehab Potential motivation, relatively acute presentation   PT Frequency 2x / week   PT Duration 6 weeks   PT Treatment/Interventions ADLs/Self Care Home Management;Aquatic Therapy;Therapeutic exercise;Therapeutic activities;Gait training;Neuromuscular re-education;Patient/family education;Dry needling;Manual techniques;Passive range of motion   Consulted and Agree with Plan of Care Patient      Patient will benefit from skilled therapeutic intervention in order to improve the following deficits and impairments:  Pain, Improper body mechanics, Impaired flexibility, Hypomobility, Decreased strength, Decreased range of motion, Decreased endurance  Visit Diagnosis: Pain in right shoulder - Plan: PT plan of care cert/re-cert  Muscle weakness (generalized) - Plan: PT plan of care cert/re-cert       G-Codes  - 04/12/2016 1600    Functional Assessment Tool Used LEFS, QuickDASH, AROM   Functional Limitation Carrying, moving and handling objects   Carrying, Moving and Handling Objects Current Status (Z7471) At least 20 percent but less than 40 percent impaired, limited or restricted   Carrying, Moving and Handling Objects Goal Status (T9539) At least 1 percent but less than 20 percent impaired, limited or restricted      Problem List Patient Active Problem List   Diagnosis Date Noted  . Postmenopausal bleeding 07/20/2015    Alison Kubicki PT DPT April 12, 2016, 4:03 PM  [place] [facility] [address] [place], [address] Phone: [phone]   Fax:  [phone]  Name: SREEJA SPIES MRN: [id number] Date of Birth: September 11, 1945

## 2016-03-21 ENCOUNTER — Ambulatory Visit: Payer: Medicare Other | Admitting: Physical Therapy

## 2016-03-21 DIAGNOSIS — M25511 Pain in right shoulder: Secondary | ICD-10-CM

## 2016-03-21 DIAGNOSIS — M6281 Muscle weakness (generalized): Secondary | ICD-10-CM

## 2016-03-21 NOTE — Therapy (Signed)
[place] [facility] [address] [place], [address] Phone: [phone]   Fax:  [phone]  Physical Therapy Treatment  Patient Details  Name: Brianna Hurst MRN: [id number] Date of Birth: 08/20/1945 Referring Provider: Rogers Blocker  Encounter Date: 03/21/2016      PT End of Session - 03/21/16 0802    Visit Number 11   Number of Visits 13   Date for PT Re-Evaluation 04/29/16   Authorization Type g code   PT Start Time 0745   PT Stop Time 0815   PT Time Calculation (min) 30 min   Activity Tolerance Patient tolerated treatment well;No increased pain   Behavior During Therapy WFL for tasks assessed/performed      Past Medical History:  Diagnosis Date  . Arthritis   . Headache   . Vaginal bleeding    discomfort    Past Surgical History:  Procedure Laterality Date  . DILATATION & CURETTAGE/HYSTEROSCOPY WITH MYOSURE N/A 07/20/2015   Procedure: DILATATION & CURETTAGE/HYSTEROSCOPY WITH MYOSURE;  Surgeon: Gae Dry, MD;  Location: [facility] ORS;  Service: Gynecology;  Laterality: N/A;  . HYSTEROSCOPY    . JOINT REPLACEMENT     Lt knee    There were no vitals filed for this visit.      Subjective Assessment - 03/21/16 0752    Subjective Pt reports she has had a very painful week with her shoulder. "I'm not making progress".   Pertinent History h/o L knee pain with TKA 2015.    Limitations Sitting;Walking   Patient Stated Goals return to gardening, to get stronger   Currently in Pain? Yes   Pain Score 1    Pain Location Shoulder   Pain Orientation Right                    Objective: Manually resisted isometrics 3x5 reps for ER, IR, extensive cuing for performance of this to 50% MVC cuing required for keeping pain level minimized with this.  Attempted shoulder mobs however pain worsened with this. Isometrics improved ROM by 20 deg. B.  Issued and had pt perform GTB isometrics, 3x10  performed (step out ER and IR).  Pt required cuing with this for posture, to understand where she should "feel pain".                  PT Long Term Goals - 03/18/16 1557      PT LONG TERM GOAL #1   Title Pt will demonstrate improvement in LEFS to greater than 50/80 indicating improvement in self reported disability due to knee pain   Baseline <45   Time 6   Period Weeks   Status Achieved     PT LONG TERM GOAL #2   Title Pt will demonstrate improvement in QuickDASH to less than 40/100 indicating improvement in shoulder function   Baseline 50/100   Time 6   Period Weeks   Status Not Met     PT LONG TERM GOAL #3   Title Pt will be I with HEP to improve R shoulder pain free AROM to 150 deg. elevation to be able to put away dishes with RUE   Baseline pain at 90, puts away dishes with LUE   Time 6   Period Weeks   Status Unable to assess               Plan - 03/21/16 0802    Clinical Impression Statement limited shoulder IR  appears to be related to poor tolerance for muscle loading. With light isometrics able to improve IR ROM by 20 deg. within session. Issued HEP to address this with GTB.   Rehab Potential Good   Clinical Impairments Affecting Rehab Potential motivation, relatively acute presentation   PT Frequency 2x / week   PT Duration 6 weeks   PT Treatment/Interventions ADLs/Self Care Home Management;Aquatic Therapy;Therapeutic exercise;Therapeutic activities;Gait training;Neuromuscular re-education;Patient/family education;Dry needling;Manual techniques;Passive range of motion   Consulted and Agree with Plan of Care Patient      Patient will benefit from skilled therapeutic intervention in order to improve the following deficits and impairments:  Pain, Improper body mechanics, Impaired flexibility, Hypomobility, Decreased strength, Decreased range of motion, Decreased endurance  Visit Diagnosis: Pain in right shoulder  Muscle weakness  (generalized)     Problem List Patient Active Problem List   Diagnosis Date Noted  . Postmenopausal bleeding 07/20/2015    Elison Worrel PT DPT 03/21/2016, 9:42 AM  [place] [facility] [address], [place], [address] Phone: [phone]   Fax:  [phone]  Name: Brianna Hurst MRN: [id number] Date of Birth: Sep 18, 1945

## 2016-03-24 ENCOUNTER — Ambulatory Visit: Payer: Medicare Other | Admitting: Physical Therapy

## 2016-03-24 DIAGNOSIS — M25511 Pain in right shoulder: Secondary | ICD-10-CM

## 2016-03-24 DIAGNOSIS — M6281 Muscle weakness (generalized): Secondary | ICD-10-CM

## 2016-03-25 NOTE — Therapy (Signed)
[place] [facility] [address] [place], [address] Phone: [phone]   Fax:  [phone]  Physical Therapy Treatment  Patient Details  Name: Brianna Hurst MRN: [id number] Date of Birth: October 12, 1945 Referring Provider: Rogers Blocker  Encounter Date: 03/24/2016      PT End of Session - 03/24/16 1115    Visit Number 12   Number of Visits 20   Date for PT Re-Evaluation 04/29/16   Authorization Type g code   PT Start Time 1324   PT Stop Time 1100   PT Time Calculation (min) 45 min   Activity Tolerance Patient tolerated treatment well;No increased pain   Behavior During Therapy WFL for tasks assessed/performed      Past Medical History:  Diagnosis Date  . Arthritis   . Headache   . Vaginal bleeding    discomfort    Past Surgical History:  Procedure Laterality Date  . DILATATION & CURETTAGE/HYSTEROSCOPY WITH MYOSURE N/A 07/20/2015   Procedure: DILATATION & CURETTAGE/HYSTEROSCOPY WITH MYOSURE;  Surgeon: Gae Dry, MD;  Location: [facility] ORS;  Service: Gynecology;  Laterality: N/A;  . HYSTEROSCOPY    . JOINT REPLACEMENT     Lt knee    There were no vitals filed for this visit.      Subjective Assessment - 03/24/16 1014    Subjective Pt reports she went to a high school reunion this weekend, noted incr. knee stiffness in B knees.    Pertinent History h/o L knee pain with TKA 2015.    Limitations Sitting;Walking   Patient Stated Goals return to gardening, to get stronger   Currently in Pain? No/denies               Objective: C-R and MFR performed on R shoulder to facilitate shoulder IR. Following this HBB improved from hand to lateral glute to sacrum, pain with this movement decr. From 7/10 to 3/10, unable to improve with continued performance.   Shoulder flexion AROM with arm bent, progressing to arm straight x5 min total practice with manual overpressure to avoid shoulder shrug too  early.  Reaching at 140 deg. To mimic putting objects into and out of microwave, noted weakness with this so performed high volume (3x25).  Attempted IR strengthening with YTB which was painful for pt so deferred this.  Performed 2x1 min each arm swings forward, backward, sideways, focus on relaxing neck, improving ROM, decr. Pain with sideways/rotational performance but with cuing for relaxation able to achieve HBB with rotation to L4.  At end of session pt reported decr. Overall pain but some continued c/o pain with IR (2/10)                  PT Education - 03/24/16 1016    Education provided Yes   Education Details progressing HEP   Person(s) Educated Patient   Methods Explanation   Comprehension Verbalized understanding             PT Long Term Goals - 03/18/16 1557      PT LONG TERM GOAL #1   Title Pt will demonstrate improvement in LEFS to greater than 50/80 indicating improvement in self reported disability due to knee pain   Baseline <45   Time 6   Period Weeks   Status Achieved     PT LONG TERM GOAL #2   Title Pt will demonstrate improvement in QuickDASH to less than 40/100 indicating improvement in shoulder function   Baseline  50/100   Time 6   Period Weeks   Status Not Met     PT LONG TERM GOAL #3   Title Pt will be I with HEP to improve R shoulder pain free AROM to 150 deg. elevation to be able to put away dishes with RUE   Baseline pain at 90, puts away dishes with LUE   Time 6   Period Weeks   Status Unable to assess               Plan - 03/24/16 1126    Clinical Impression Statement Unable to make significant change in shoulder IR within session, discussed possibility of assessing for neural tension at next session as pt is having significant cervical issues in and ROM limitation appears to be in radial nerve distribution. C-R improved ROM by 10 deg but this was the same 10 deg. improved from previous session. Pt is now able to  pain free have shoulder flexion to 170 deg. Will continue to work on strengthening into new shoulder flexion ROM and to improve IR ROM and decr. pain.   Rehab Potential Good   Clinical Impairments Affecting Rehab Potential motivation, relatively acute presentation   PT Frequency 2x / week   PT Duration 6 weeks   PT Treatment/Interventions ADLs/Self Care Home Management;Aquatic Therapy;Therapeutic exercise;Therapeutic activities;Gait training;Neuromuscular re-education;Patient/family education;Dry needling;Manual techniques;Passive range of motion   Consulted and Agree with Plan of Care Patient      Patient will benefit from skilled therapeutic intervention in order to improve the following deficits and impairments:  Pain, Improper body mechanics, Impaired flexibility, Hypomobility, Decreased strength, Decreased range of motion, Decreased endurance  Visit Diagnosis: Pain in right shoulder  Muscle weakness (generalized)     Problem List Patient Active Problem List   Diagnosis Date Noted  . Postmenopausal bleeding 07/20/2015    Agustin Swatek 03/25/2016, 6:56 AM  [place] [facility] [address] [place], [address] Phone: [phone]   Fax:  [phone]  Name: Brianna Hurst MRN: [id number] Date of Birth: May 18, 1946

## 2016-03-28 ENCOUNTER — Ambulatory Visit: Payer: Medicare Other | Admitting: Physical Therapy

## 2016-03-28 DIAGNOSIS — M6281 Muscle weakness (generalized): Secondary | ICD-10-CM

## 2016-03-28 DIAGNOSIS — M25511 Pain in right shoulder: Secondary | ICD-10-CM

## 2016-03-28 NOTE — Therapy (Signed)
[place] [facility] [address], [place], [address] Phone: [phone]   Fax:  [phone]  Physical Therapy Treatment  Patient Details  Name: Brianna Hurst MRN: [id number] Date of Birth: [birth year] Referring Provider: Rogers Blocker  Encounter Date: 03/28/2016      PT End of Session - 03/28/16 1100    Visit Number 13   Number of Visits 20   Date for PT Re-Evaluation 04/29/16   Authorization Type g code   PT Start Time 0945   PT Stop Time 1030   PT Time Calculation (min) 45 min   Activity Tolerance Patient tolerated treatment well;No increased pain   Behavior During Therapy WFL for tasks assessed/performed      Past Medical History:  Diagnosis Date  . Arthritis   . Headache   . Vaginal bleeding    discomfort    Past Surgical History:  Procedure Laterality Date  . DILATATION & CURETTAGE/HYSTEROSCOPY WITH MYOSURE N/A 07/20/2015   Procedure: DILATATION & CURETTAGE/HYSTEROSCOPY WITH MYOSURE;  Surgeon: Gae Dry, MD;  Location: [facility] ORS;  Service: Gynecology;  Laterality: N/A;  . HYSTEROSCOPY    . JOINT REPLACEMENT     Lt knee    There were no vitals filed for this visit.      Subjective Assessment - 03/28/16 0953    Subjective Pt reports decr. shoulder pain, but she is "still aware of it". Her B knees are significantly painful.   Pertinent History h/o L knee pain with TKA 2015.    Limitations Sitting;Walking   Patient Stated Goals return to gardening, to get stronger   Currently in Pain? No/denies   Pain Score 0-No pain              Objective:/ Extensive TNE performed, including focus on overflow, understanding pain threshholds, multiple mechanisms for neural imput including temperature, stress, overflow.   Pt verbalized understanding.  Performed knee self stretches to address ROM and stiffness. Following this reported 0/10 knee pain.  Reviewed and corrected arm swings 3x1 min  forward, sideways, rotational.                  PT Education - 03/28/16 0954    Education provided (P)  Yes   Education Details (P)  Progressing knee exercises.   Person(s) Educated (P)  Patient   Methods (P)  Explanation   Comprehension (P)  Verbalized understanding             PT Long Term Goals - 03/18/16 1557      PT LONG TERM GOAL #1   Title Pt will demonstrate improvement in LEFS to greater than 50/80 indicating improvement in self reported disability due to knee pain   Baseline <45   Time 6   Period Weeks   Status Achieved     PT LONG TERM GOAL #2   Title Pt will demonstrate improvement in QuickDASH to less than 40/100 indicating improvement in shoulder function   Baseline 50/100   Time 6   Period Weeks   Status Not Met     PT LONG TERM GOAL #3   Title Pt will be I with HEP to improve R shoulder pain free AROM to 150 deg. elevation to be able to put away dishes with RUE   Baseline pain at 90, puts away dishes with LUE   Time 6   Period Weeks   Status Unable to assess  Plan - 03/28/16 1100    Clinical Impression Statement Shoulder is much better today, session focused on reviewing shoulder exercises to progress this. Also addressed continued knee pain and shoulder pain with extensive education/TNE.   Rehab Potential Good   Clinical Impairments Affecting Rehab Potential motivation, relatively acute presentation   PT Frequency 2x / week   PT Duration 6 weeks   PT Treatment/Interventions ADLs/Self Care Home Management;Aquatic Therapy;Therapeutic exercise;Therapeutic activities;Gait training;Neuromuscular re-education;Patient/family education;Dry needling;Manual techniques;Passive range of motion   Consulted and Agree with Plan of Care Patient      Patient will benefit from skilled therapeutic intervention in order to improve the following deficits and impairments:  Pain, Improper body mechanics, Impaired flexibility,  Hypomobility, Decreased strength, Decreased range of motion, Decreased endurance  Visit Diagnosis: Pain in right shoulder  Muscle weakness (generalized)     Problem List Patient Active Problem List   Diagnosis Date Noted  . Postmenopausal bleeding 07/20/2015    Fisher,Benjamin 03/28/2016, 11:04 AM  [place] [facility] [address], [place], [address] Phone: [phone]   Fax:  [phone]  Name: Brianna Hurst MRN: [id number] Date of Birth: [birth year]

## 2016-03-31 ENCOUNTER — Ambulatory Visit: Payer: Medicare Other | Admitting: Physical Therapy

## 2016-03-31 DIAGNOSIS — M25511 Pain in right shoulder: Secondary | ICD-10-CM | POA: Diagnosis not present

## 2016-03-31 DIAGNOSIS — M6281 Muscle weakness (generalized): Secondary | ICD-10-CM

## 2016-03-31 NOTE — Therapy (Signed)
[facility] [address], [place], [address] Phone: [phone]   Fax:  [phone]  Physical Therapy Treatment  Patient Details  Name: Brianna Hurst MRN: [id number] Date of Birth: 01/30/1946 Referring Provider: Rogers Blocker  Encounter Date: 03/31/2016      PT End of Session - 03/31/16 1106    Visit Number 14   Number of Visits 20   Date for PT Re-Evaluation 04/29/16   Authorization Type g code   PT Start Time 6144   PT Stop Time 1100   PT Time Calculation (min) 45 min   Activity Tolerance Patient tolerated treatment well;No increased pain   Behavior During Therapy WFL for tasks assessed/performed      Past Medical History:  Diagnosis Date  . Arthritis   . Headache   . Vaginal bleeding    discomfort    Past Surgical History:  Procedure Laterality Date  . DILATATION & CURETTAGE/HYSTEROSCOPY WITH MYOSURE N/A 07/20/2015   Procedure: DILATATION & CURETTAGE/HYSTEROSCOPY WITH MYOSURE;  Surgeon: Gae Dry, MD;  Location: [facility];  Service: Gynecology;  Laterality: N/A;  . HYSTEROSCOPY    . JOINT REPLACEMENT     Lt knee    There were no vitals filed for this visit.      Subjective Assessment - 03/31/16 1013    Subjective (P)  Pt reports  she is doing better. She is "still aware" of her pain.    Pertinent History (P)  h/o L knee pain with TKA 2015.    Limitations (P)  Sitting;Walking   Patient Stated Goals (P)  return to gardening, to get stronger           Objective: Focus of session on gym routine: educated and had pt demonstrate performance of:  Leg press Heel raise Knee extension (unilateral) Knee flexion (unilateral) Shoulder row with 25# Chest press 15#  Following this, introduced feldenkrais based shoulder AROM exercises, including seated shoulder flexion, abduction, focusing on light activation and avoiding pain.  Pt verbalized understanding of  exercise.                           PT Long Term Goals - 03/18/16 1557      PT LONG TERM GOAL #1   Title Pt will demonstrate improvement in LEFS to greater than 50/80 indicating improvement in self reported disability due to knee pain   Baseline <45   Time 6   Period Weeks   Status Achieved     PT LONG TERM GOAL #2   Title Pt will demonstrate improvement in QuickDASH to less than 40/100 indicating improvement in shoulder function   Baseline 50/100   Time 6   Period Weeks   Status Not Met     PT LONG TERM GOAL #3   Title Pt will be I with HEP to improve R shoulder pain free AROM to 150 deg. elevation to be able to put away dishes with RUE   Baseline pain at 90, puts away dishes with LUE   Time 6   Period Weeks   Status Unable to assess               Plan - 03/31/16 1107    Clinical Impression Statement Pt will be attempting to go to a gym for followup session with a personal trainer due to continued weakness in LE and core. Focus of session on issuing an appropriate gym  routine which pt will attempt over the next two days and follow up back with PT for appropriateness and progression.   Rehab Potential Good   Clinical Impairments Affecting Rehab Potential motivation, relatively acute presentation   PT Frequency 2x / week   PT Duration 6 weeks   PT Treatment/Interventions ADLs/Self Care Home Management;Aquatic Therapy;Therapeutic exercise;Therapeutic activities;Gait training;Neuromuscular re-education;Patient/family education;Dry needling;Manual techniques;Passive range of motion   Consulted and Agree with Plan of Care Patient      Patient will benefit from skilled therapeutic intervention in order to improve the following deficits and impairments:  Pain, Improper body mechanics, Impaired flexibility, Hypomobility, Decreased strength, Decreased range of motion, Decreased endurance  Visit Diagnosis: Muscle weakness (generalized)     Problem  List Patient Active Problem List   Diagnosis Date Noted  . Postmenopausal bleeding 07/20/2015    Rockney Grenz PT DPT 03/31/2016, 11:10 AM  [facility] [address] [place], [address] Phone: [phone]   Fax:  [phone]  Name: Brianna Hurst MRN: [id number] Date of Birth: [birth year]

## 2016-04-04 ENCOUNTER — Encounter: Payer: Medicare Other | Admitting: Physical Therapy

## 2016-04-15 ENCOUNTER — Ambulatory Visit: Payer: Medicare Other | Attending: Obstetrics & Gynecology | Admitting: Physical Therapy

## 2016-04-15 DIAGNOSIS — M6281 Muscle weakness (generalized): Secondary | ICD-10-CM | POA: Insufficient documentation

## 2016-04-15 DIAGNOSIS — M25511 Pain in right shoulder: Secondary | ICD-10-CM | POA: Diagnosis not present

## 2016-04-15 DIAGNOSIS — G8929 Other chronic pain: Secondary | ICD-10-CM | POA: Diagnosis present

## 2016-04-16 NOTE — Therapy (Signed)
[facility] [address], [place], [address] Phone: [phone]   Fax:  [phone]  Physical Therapy Treatment  Patient Details  Name: Brianna Hurst MRN: [id number] Date of Birth: [birth year] Referring Provider: Rogers Blocker  Encounter Date: 04/15/2016    Past Medical History:  Diagnosis Date  . Arthritis   . Headache   . Vaginal bleeding    discomfort    Past Surgical History:  Procedure Laterality Date  . DILATATION & CURETTAGE/HYSTEROSCOPY WITH MYOSURE N/A 07/20/2015   Procedure: DILATATION & CURETTAGE/HYSTEROSCOPY WITH MYOSURE;  Surgeon: Gae Dry, MD;  Location: [facility];  Service: Gynecology;  Laterality: N/A;  . HYSTEROSCOPY    . JOINT REPLACEMENT     Lt knee    There were no vitals filed for this visit.      Objective: Fo cus of session on manual intervention to address pain:  STM performed on R levator, UT, scalene, deltoid, bicep, and tricep. Following this pain with elevation decr. "moderately" and ROM improved by 10 deg.  [place] joint mobs also performed 3x1 min with focus onend range R2 oscillation. No change in symptoms with this, performed in neutral.  Manually resisted deltoid, biceps activation, 3x10 facilitated reps of each.  Following this pt reported incr. Ache but decr. Pain. Will focus on this at next session.                              PT Long Term Goals - 03/18/16 1557      PT LONG TERM GOAL #1   Title Pt will demonstrate improvement in LEFS to greater than 50/80 indicating improvement in self reported disability due to knee pain   Baseline <45   Time 6   Period Weeks   Status Achieved     PT LONG TERM GOAL #2   Title Pt will demonstrate improvement in QuickDASH to less than 40/100 indicating improvement in shoulder function   Baseline 50/100   Time 6   Period Weeks   Status Not Met     PT LONG TERM GOAL #3   Title Pt will be I  with HEP to improve R shoulder pain free AROM to 150 deg. elevation to be able to put away dishes with RUE   Baseline pain at 90, puts away dishes with LUE   Time 6   Period Weeks   Status Unable to assess             Patient will benefit from skilled therapeutic intervention in order to improve the following deficits and impairments:  Pain, Improper body mechanics, Impaired flexibility, Hypomobility, Decreased strength, Decreased range of motion, Decreased endurance  Visit Diagnosis: Chronic right shoulder pain     Problem List Patient Active Problem List   Diagnosis Date Noted  . Postmenopausal bleeding 07/20/2015    Fisher,Benjamin PT DPT 04/16/2016, 6:49 PM  [facility] [address] [place], [address] Phone: [phone]   Fax:  [phone]  Name: Brianna Hurst MRN: [id number] Date of Birth: 14-Apr-1946

## 2016-04-17 ENCOUNTER — Ambulatory Visit: Payer: Medicare Other | Admitting: Physical Therapy

## 2016-04-17 DIAGNOSIS — M25511 Pain in right shoulder: Secondary | ICD-10-CM | POA: Diagnosis not present

## 2016-04-17 DIAGNOSIS — M6281 Muscle weakness (generalized): Secondary | ICD-10-CM

## 2016-04-17 DIAGNOSIS — G8929 Other chronic pain: Secondary | ICD-10-CM

## 2016-04-17 NOTE — Therapy (Signed)
[facility] [address] [place], [address] Phone: [phone]   Fax:  [phone]  Physical Therapy Treatment  Patient Details  Name: Brianna Hurst MRN: [id number] Date of Birth: [birth year] Referring Provider: Rogers Blocker  Encounter Date: 04/17/2016      PT End of Session - 04/17/16 0946    Visit Number 16   Number of Visits 20   Date for PT Re-Evaluation 04/29/16   Authorization Type g code   PT Start Time 0930   PT Stop Time 1017   PT Time Calculation (min) 47 min   Activity Tolerance Patient tolerated treatment well;No increased pain   Behavior During Therapy WFL for tasks assessed/performed      Past Medical History:  Diagnosis Date  . Arthritis   . Headache   . Vaginal bleeding    discomfort    Past Surgical History:  Procedure Laterality Date  . DILATATION & CURETTAGE/HYSTEROSCOPY WITH MYOSURE N/A 07/20/2015   Procedure: DILATATION & CURETTAGE/HYSTEROSCOPY WITH MYOSURE;  Surgeon: Gae Dry, MD;  Location: [facility];  Service: Gynecology;  Laterality: N/A;  . HYSTEROSCOPY    . JOINT REPLACEMENT     Lt knee    There were no vitals filed for this visit.      Subjective Assessment - 04/17/16 0937    Subjective Pt reports she had a massage, is having some pain similar to previous sessoin however not nearly as bad as previously. She is also noting decr. pain with washing hair.   Pertinent History h/o L knee pain with TKA 2015.    Limitations Sitting;Walking   Patient Stated Goals return to gardening, to get stronger   Currently in Pain? No/denies   Pain Score 0-No pain              Objective:                        PT Long Term Goals - 03/18/16 1557      PT LONG TERM GOAL #1   Title Pt will demonstrate improvement in LEFS to greater than 50/80 indicating improvement in self reported disability due to knee pain   Baseline <45   Time 6   Period Weeks    Status Achieved     PT LONG TERM GOAL #2   Title Pt will demonstrate improvement in QuickDASH to less than 40/100 indicating improvement in shoulder function   Baseline 50/100   Time 6   Period Weeks   Status Not Met     PT LONG TERM GOAL #3   Title Pt will be I with HEP to improve R shoulder pain free AROM to 150 deg. elevation to be able to put away dishes with RUE   Baseline pain at 90, puts away dishes with LUE   Time 6   Period Weeks   Status Unable to assess               Plan - 04/17/16 1205    Clinical Impression Statement Pt has improved considerably with    Rehab Potential Good   Clinical Impairments Affecting Rehab Potential motivation, relatively acute presentation   PT Frequency 2x / week   PT Duration 6 weeks   PT Treatment/Interventions ADLs/Self Care Home Management;Aquatic Therapy;Therapeutic exercise;Therapeutic activities;Gait training;Neuromuscular re-education;Patient/family education;Dry needling;Manual techniques;Passive range of motion   Consulted and Agree with Plan of Care Patient      Patient will  benefit from skilled therapeutic intervention in order to improve the following deficits and impairments:  Pain, Improper body mechanics, Impaired flexibility, Hypomobility, Decreased strength, Decreased range of motion, Decreased endurance  Visit Diagnosis: Chronic right shoulder pain  Muscle weakness (generalized)     Problem List Patient Active Problem List   Diagnosis Date Noted  . Postmenopausal bleeding 07/20/2015    Amado Andal PT DPT 04/17/2016, 12:08 PM  [facility] [address], [place], [address] Phone: [phone]   Fax:  [phone]  Name: Brianna Hurst MRN: [id number] Date of Birth: 07/16/1945

## 2016-04-21 ENCOUNTER — Ambulatory Visit: Payer: Medicare Other | Admitting: Physical Therapy

## 2016-04-21 DIAGNOSIS — M25511 Pain in right shoulder: Principal | ICD-10-CM

## 2016-04-21 DIAGNOSIS — M6281 Muscle weakness (generalized): Secondary | ICD-10-CM

## 2016-04-21 DIAGNOSIS — G8929 Other chronic pain: Secondary | ICD-10-CM

## 2016-04-21 NOTE — Therapy (Signed)
[facility] [address] [place], [address] Phone: [phone]   Fax:  [phone]  Physical Therapy Treatment  Patient Details  Name: Brianna Hurst MRN: [id number] Date of Birth: 1946/03/15 Referring Provider: Rogers Blocker  Encounter Date: 04/21/2016      PT End of Session - 04/21/16 0740    Visit Number 17   Number of Visits 20   Date for PT Re-Evaluation 04/29/16   Authorization Type g code   PT Start Time 0730   PT Stop Time 0758   PT Time Calculation (min) 28 min   Activity Tolerance Patient tolerated treatment well;No increased pain   Behavior During Therapy WFL for tasks assessed/performed      Past Medical History:  Diagnosis Date  . Arthritis   . Headache   . Vaginal bleeding    discomfort    Past Surgical History:  Procedure Laterality Date  . DILATATION & CURETTAGE/HYSTEROSCOPY WITH MYOSURE N/A 07/20/2015   Procedure: DILATATION & CURETTAGE/HYSTEROSCOPY WITH MYOSURE;  Surgeon: Gae Dry, MD;  Location: [facility];  Service: Gynecology;  Laterality: N/A;  . HYSTEROSCOPY    . JOINT REPLACEMENT     Lt knee    There were no vitals filed for this visit.      Subjective Assessment - 04/21/16 0731    Subjective Pt reports her shoulder is getting better. she has been consistent with her HEP.   Pertinent History h/o L knee pain with TKA 2015.    Limitations Sitting;Walking   Patient Stated Goals return to gardening, to get stronger   Currently in Pain? No/denies   Pain Score 0-No pain                  Objective: 2# wt shoulder abduction 3x10.  Following this performed manually assisted shoulder abduction, then one additional bout of shoulder abduction able to achieve 40 deg (up from 20) pain free.  Shoulder press with 2# wt 3x8 with cuing to avoid scapular shrug.  Unweighted shoulder press with manual cuing for decr. Shoulder hike.  UE ranger x10 min total in  multiple ranges, initially painful with eccentric control but improved with cuing to "pull" down instead of relaxing.  Following session t reported incr. Stiffness. Required cuing throughout session for control and to decr. Pain.                      PT Long Term Goals - 03/18/16 1557      PT LONG TERM GOAL #1   Title Pt will demonstrate improvement in LEFS to greater than 50/80 indicating improvement in self reported disability due to knee pain   Baseline <45   Time 6   Period Weeks   Status Achieved     PT LONG TERM GOAL #2   Title Pt will demonstrate improvement in QuickDASH to less than 40/100 indicating improvement in shoulder function   Baseline 50/100   Time 6   Period Weeks   Status Not Met     PT LONG TERM GOAL #3   Title Pt will be I with HEP to improve R shoulder pain free AROM to 150 deg. elevation to be able to put away dishes with RUE   Baseline pain at 90, puts away dishes with LUE   Time 6   Period Weeks   Status Unable to assess  Plan - 04/21/16 0740    Clinical Impression Statement Pt is still highly limited in ROM with strengthening exercises, pain improved considerably with AAROM and cuing to improve scapular control. Pain free abduction to 40 deg. on R.   Rehab Potential Good   Clinical Impairments Affecting Rehab Potential motivation, relatively acute presentation   PT Frequency 2x / week   PT Duration 6 weeks   PT Treatment/Interventions ADLs/Self Care Home Management;Aquatic Therapy;Therapeutic exercise;Therapeutic activities;Gait training;Neuromuscular re-education;Patient/family education;Dry needling;Manual techniques;Passive range of motion   Consulted and Agree with Plan of Care Patient      Patient will benefit from skilled therapeutic intervention in order to improve the following deficits and impairments:  Pain, Improper body mechanics, Impaired flexibility, Hypomobility, Decreased strength, Decreased range  of motion, Decreased endurance  Visit Diagnosis: Chronic right shoulder pain  Muscle weakness (generalized)     Problem List Patient Active Problem List   Diagnosis Date Noted  . Postmenopausal bleeding 07/20/2015    Aeden Matranga PT DPT  04/21/2016, 8:03 AM  [facility] [address] [place], [address] Phone: [phone]   Fax:  [phone]  Name: ZARAHI FUERST MRN: [id number] Date of Birth: [birth year]

## 2016-04-22 ENCOUNTER — Encounter: Payer: Medicare Other | Admitting: Physical Therapy

## 2016-04-29 ENCOUNTER — Ambulatory Visit: Payer: Medicare Other | Admitting: Physical Therapy

## 2016-05-05 ENCOUNTER — Encounter: Payer: Medicare Other | Admitting: Physical Therapy

## 2016-05-09 ENCOUNTER — Ambulatory Visit: Payer: Medicare Other | Attending: Obstetrics & Gynecology | Admitting: Physical Therapy

## 2016-05-09 DIAGNOSIS — G8929 Other chronic pain: Secondary | ICD-10-CM | POA: Insufficient documentation

## 2016-05-09 DIAGNOSIS — M25511 Pain in right shoulder: Secondary | ICD-10-CM | POA: Insufficient documentation

## 2016-05-10 NOTE — Therapy (Signed)
[facility] [address] [place], [address] Phone: [phone]   Fax:  [phone]  Physical Therapy Treatment  Patient Details  Name: Brianna Hurst MRN: [id number] Date of Birth: 25-Jun-1946 Referring Provider: Rogers Blocker  Encounter Date: 05/09/2016      PT End of Session - 05/10/16 1456    Visit Number 18   Number of Visits 20   Date for PT Re-Evaluation 04/29/16   Authorization Type g code   PT Start Time 1030   PT Stop Time 1040   PT Time Calculation (min) 10 min   Activity Tolerance Patient tolerated treatment well;No increased pain   Behavior During Therapy WFL for tasks assessed/performed      Past Medical History:  Diagnosis Date  . Arthritis   . Headache   . Vaginal bleeding    discomfort    Past Surgical History:  Procedure Laterality Date  . DILATATION & CURETTAGE/HYSTEROSCOPY WITH MYOSURE N/A 07/20/2015   Procedure: DILATATION & CURETTAGE/HYSTEROSCOPY WITH MYOSURE;  Surgeon: Gae Dry, MD;  Location: [facility];  Service: Gynecology;  Laterality: N/A;  . HYSTEROSCOPY    . JOINT REPLACEMENT     Lt knee    There were no vitals filed for this visit.      Subjective Assessment - 05/10/16 1455    Subjective Pt has been exercising at the gym, had several questions about this but overall is pleased with her current situation.   Pertinent History h/o L knee pain with TKA 2015.    Limitations Sitting;Walking   Patient Stated Goals return to gardening, to get stronger   Currently in Pain? No/denies                    Objective: Pt had multiple questions regarding HEP, massage. PT answered pt questions to pt satisfaction. No charge for session as pt stayed only for very short period of time.                  PT Long Term Goals - 03/18/16 1557      PT LONG TERM GOAL #1   Title Pt will demonstrate improvement in LEFS to greater than 50/80 indicating  improvement in self reported disability due to knee pain   Baseline <45   Time 6   Period Weeks   Status Achieved     PT LONG TERM GOAL #2   Title Pt will demonstrate improvement in QuickDASH to less than 40/100 indicating improvement in shoulder function   Baseline 50/100   Time 6   Period Weeks   Status Not Met     PT LONG TERM GOAL #3   Title Pt will be I with HEP to improve R shoulder pain free AROM to 150 deg. elevation to be able to put away dishes with RUE   Baseline pain at 90, puts away dishes with LUE   Time 6   Period Weeks   Status Unable to assess               Plan - 05/09/16 1456    Clinical Impression Statement At this time pt is appropriate for d/c with focus on exercising as well as massage. Encouraged pt to remain in contact and to return if pain returns. Pt continues to have some limitation in ROM and reaching with shoulder, however this is inconsistent now.   Rehab Potential Good   Clinical Impairments Affecting Rehab Potential motivation, relatively  acute presentation   PT Frequency 2x / week   PT Duration 6 weeks   PT Treatment/Interventions ADLs/Self Care Home Management;Aquatic Therapy;Therapeutic exercise;Therapeutic activities;Gait training;Neuromuscular re-education;Patient/family education;Dry needling;Manual techniques;Passive range of motion   Consulted and Agree with Plan of Care Patient      Patient will benefit from skilled therapeutic intervention in order to improve the following deficits and impairments:  Pain, Improper body mechanics, Impaired flexibility, Hypomobility, Decreased strength, Decreased range of motion, Decreased endurance  Visit Diagnosis: Chronic right shoulder pain       G-Codes - 05-23-16 1502    Functional Assessment Tool Used LEFS, QuickDASH, AROM   Carrying, Moving and Handling Objects Current Status ([id number]) At least 20 percent but less than 40 percent impaired, limited or restricted      Problem  List Patient Active Problem List   Diagnosis Date Noted  . Postmenopausal bleeding 07/20/2015    Fisher,Benjamin PT DPT 05/10/2016, 3:03 PM  [facility] [address] [place], [address] Phone: [phone]   Fax:  [phone]  Name: Brianna Hurst MRN: [id number] Date of Birth: June 25, 1946

## 2016-05-12 ENCOUNTER — Encounter: Payer: Medicare Other | Admitting: Physical Therapy

## 2020-04-26 ENCOUNTER — Other Ambulatory Visit: Payer: Self-pay | Admitting: Gastroenterology

## 2020-04-26 DIAGNOSIS — R1031 Right lower quadrant pain: Secondary | ICD-10-CM

## 2020-05-04 ENCOUNTER — Other Ambulatory Visit: Payer: Self-pay

## 2020-05-04 ENCOUNTER — Ambulatory Visit
Admission: RE | Admit: 2020-05-04 | Discharge: 2020-05-04 | Disposition: A | Discharge status: Home / Self Care | Payer: Medicare Other | Source: Ambulatory Visit | Attending: Gastroenterology | Admitting: Gastroenterology

## 2020-05-04 DIAGNOSIS — R1031 Right lower quadrant pain: Secondary | ICD-10-CM

## 2021-10-23 IMAGING — US US PELVIS COMPLETE WITH TRANSVAGINAL
1 series · 14 of 25 positions shown · non-contrast
Comparison: None

CLINICAL DATA: Right lower quadrant pain

EXAM:
TRANSABDOMINAL AND TRANSVAGINAL ULTRASOUND OF PELVIS
TECHNIQUE: Both transabdominal and transvaginal ultrasound examinations of the
pelvis were performed. Transabdominal technique was performed for
global imaging of the pelvis including uterus, ovaries, adnexal
regions, and pelvic cul-de-sac. It was necessary to proceed with
endovaginal exam following the transabdominal exam to visualize the
uterus endometrium adnexa.

[Series 1: us pelvis complete with transvaginal · 0.20mm/px · 14 of 63 slices shown]
[im 1/63]
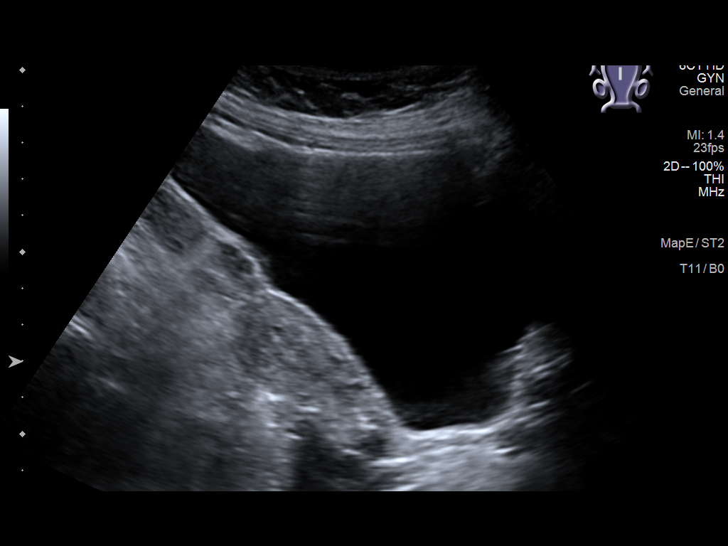
[im 6/63]
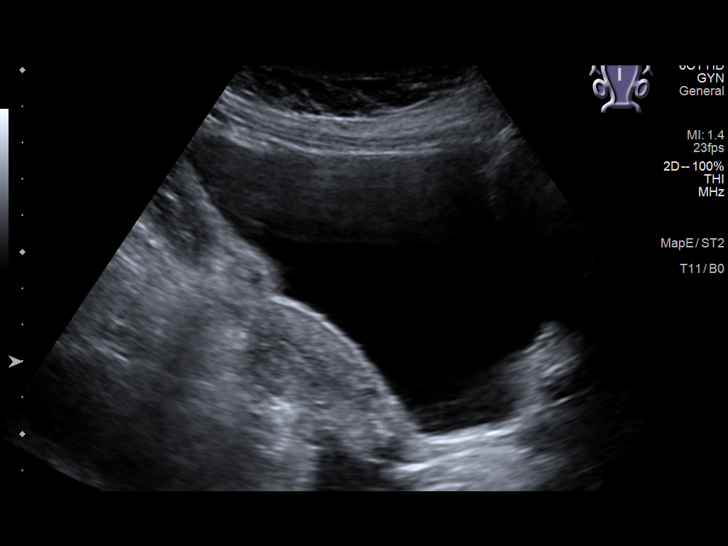
[im 11/63]
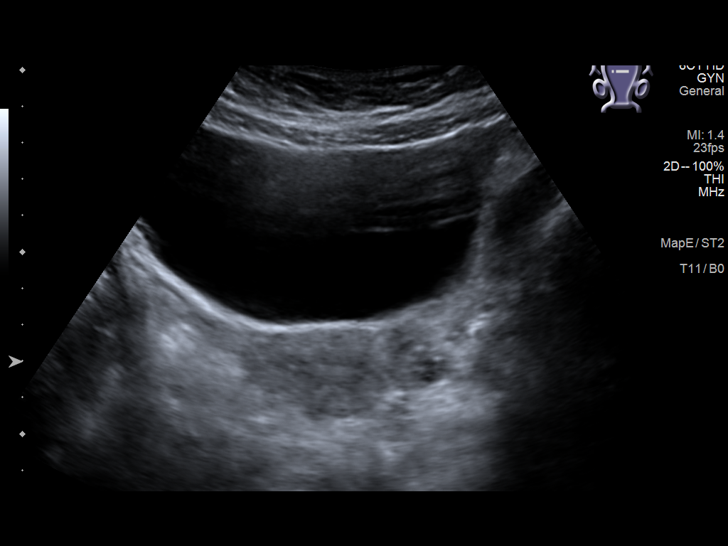
[im 16/63]
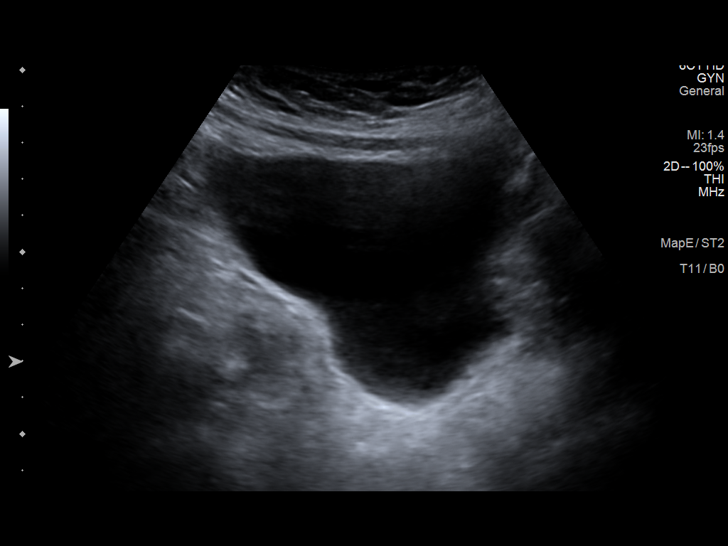
[im 21/63]
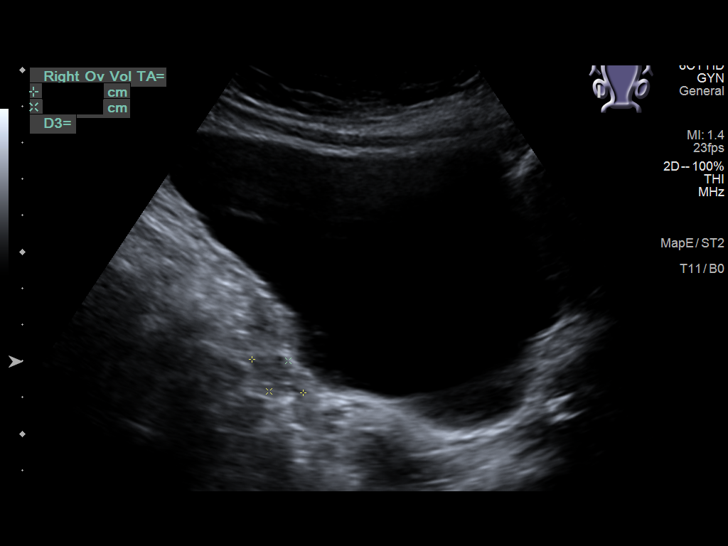
[im 24/63]
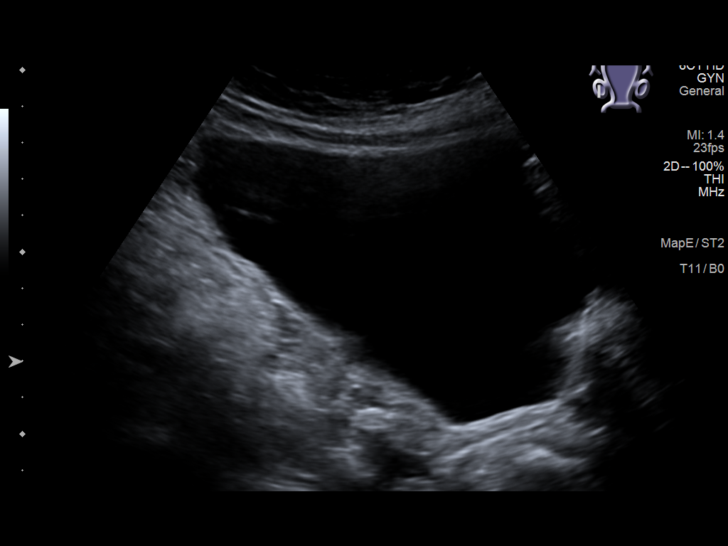
[im 29/63]
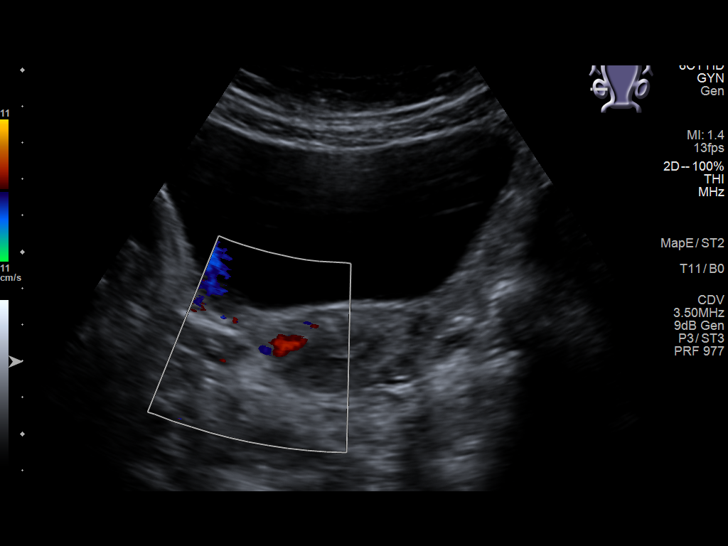
[im 34/63]
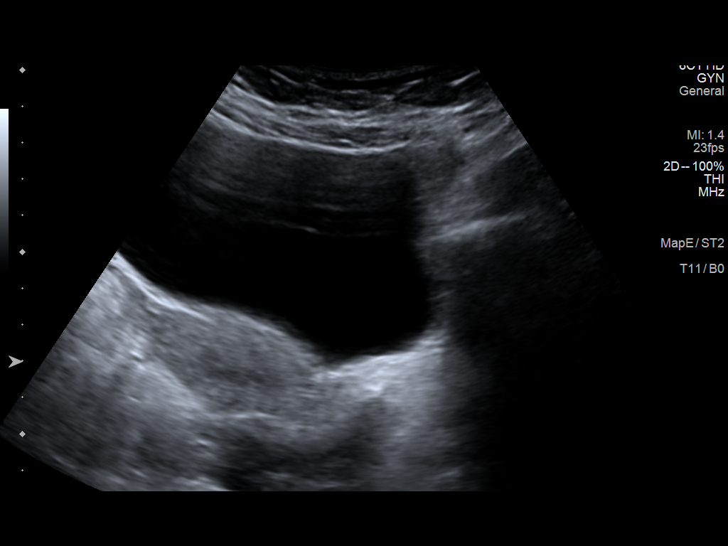
[im 39/63]
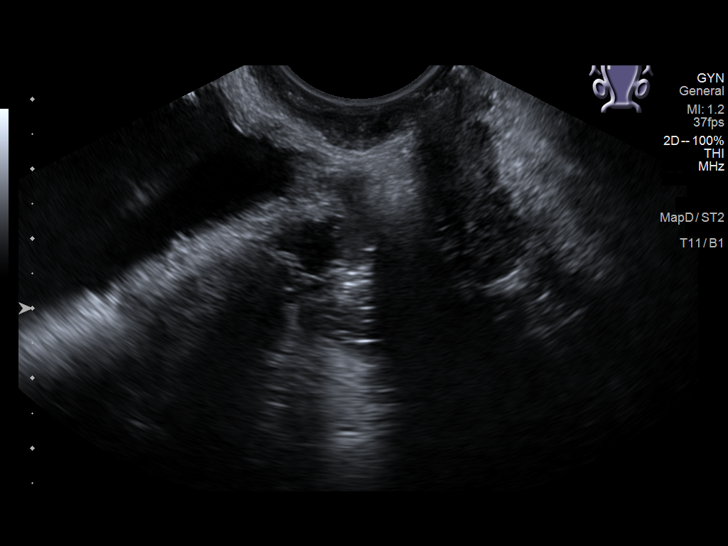
[im 42/63]
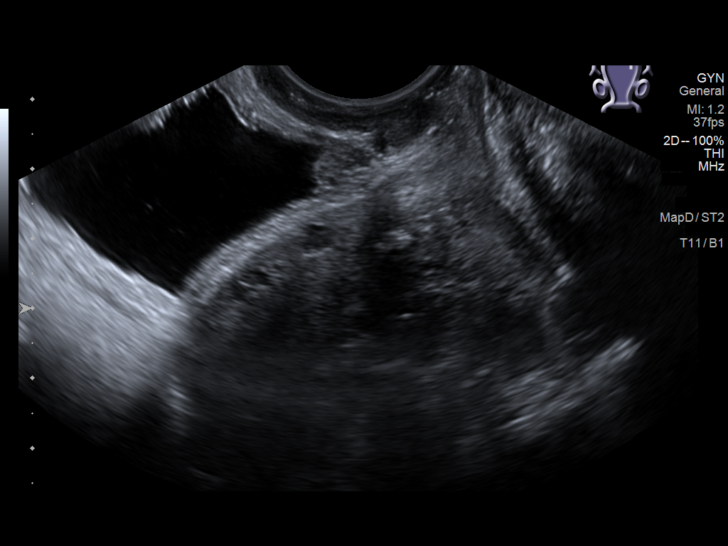
[im 47/63]
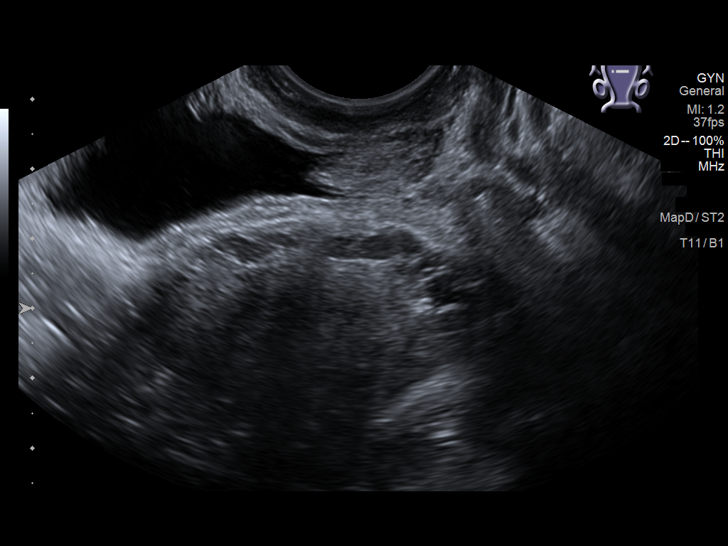
[im 52/63]
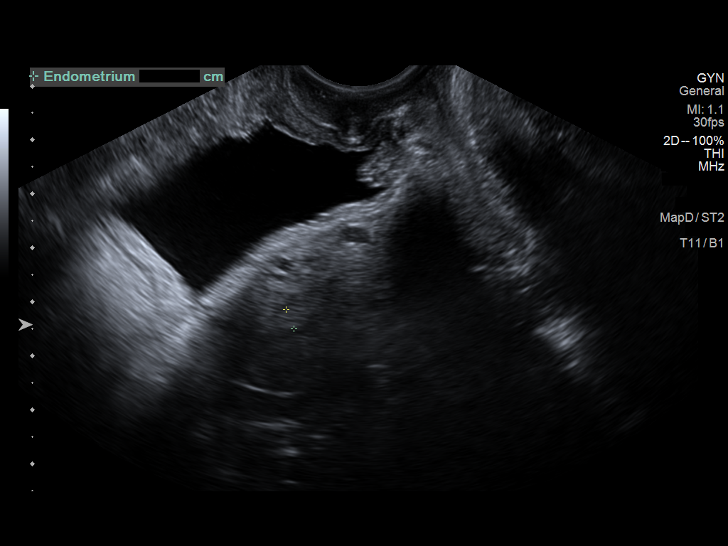
[im 57/63]
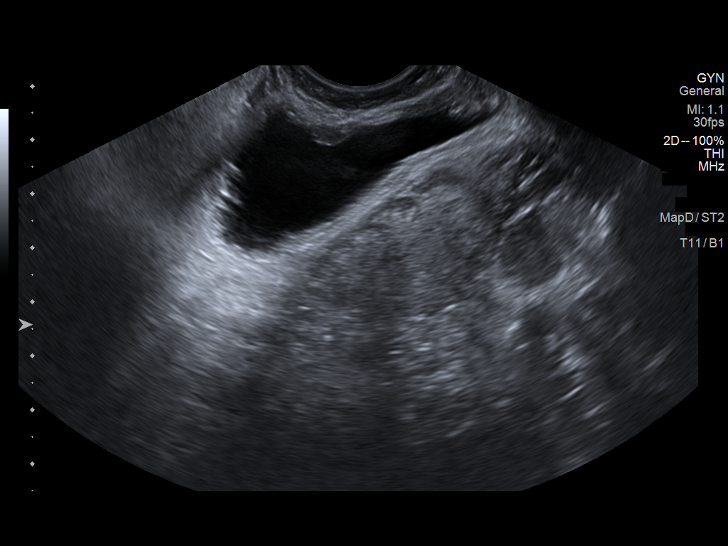
[im 63/63]
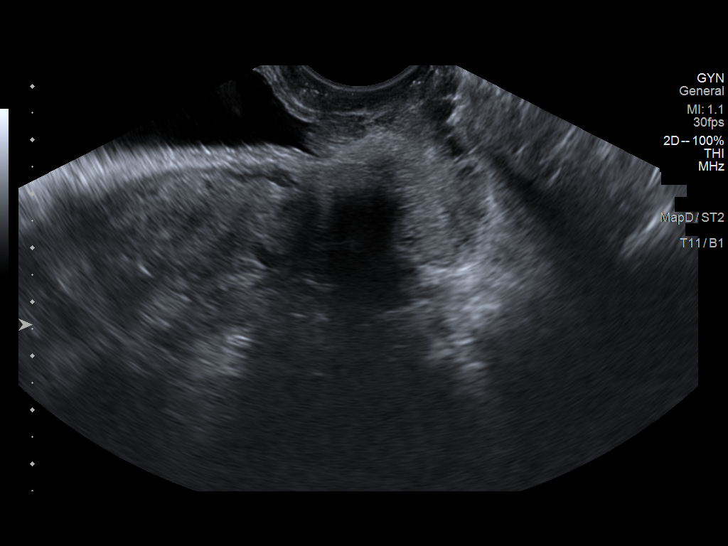

[14 of 25 positions shown; findings below may reference images not displayed]

FINDINGS: Uterus

Measurements: 5.5 x 2.3 x 4.2 cm = volume: 28 mL. No fibroids or
other mass visualized.

Endometrium

Thickness: 3.8 mm.  No focal abnormality visualized.

Right ovary

Measurements: 1.7 x 0.9 x 2.1 cm = volume: 2 mL. Normal
appearance/no adnexal mass.

Left ovary

Nonvisualized.

Other findings

No abnormal free fluid.
IMPRESSION: 1. Nonvisualized left ovary.
2. Otherwise negative pelvic ultrasound

## 2022-02-05 ENCOUNTER — Other Ambulatory Visit: Payer: Self-pay | Admitting: Obstetrics & Gynecology

## 2023-03-11 ENCOUNTER — Encounter: Payer: Self-pay | Admitting: Obstetrics & Gynecology

## 2023-03-12 ENCOUNTER — Other Ambulatory Visit: Payer: Self-pay | Admitting: Obstetrics & Gynecology

## 2023-03-12 DIAGNOSIS — Z1231 Encounter for screening mammogram for malignant neoplasm of breast: Secondary | ICD-10-CM

## 2023-04-01 ENCOUNTER — Ambulatory Visit
Admission: RE | Admit: 2023-04-01 | Discharge: 2023-04-01 | Disposition: A | Discharge status: Home / Self Care | Payer: Medicare Other | Source: Ambulatory Visit | Attending: Obstetrics & Gynecology | Admitting: Obstetrics & Gynecology

## 2023-04-01 DIAGNOSIS — Z1231 Encounter for screening mammogram for malignant neoplasm of breast: Secondary | ICD-10-CM | POA: Diagnosis present

## 2024-05-03 ENCOUNTER — Encounter: Payer: Self-pay | Admitting: Physician Assistant

## 2024-05-03 ENCOUNTER — Other Ambulatory Visit: Payer: Self-pay | Admitting: Physician Assistant

## 2024-05-03 DIAGNOSIS — Z1231 Encounter for screening mammogram for malignant neoplasm of breast: Secondary | ICD-10-CM

## 2024-05-06 ENCOUNTER — Ambulatory Visit
Admission: RE | Admit: 2024-05-06 | Discharge: 2024-05-06 | Disposition: A | Discharge status: Home / Self Care | Source: Ambulatory Visit | Attending: Physician Assistant | Admitting: Physician Assistant

## 2024-05-06 DIAGNOSIS — Z1231 Encounter for screening mammogram for malignant neoplasm of breast: Secondary | ICD-10-CM | POA: Diagnosis present

## 2024-06-06 ENCOUNTER — Encounter
